# Patient Record
Sex: Female | Born: 1945 | Race: White | Hispanic: No | Marital: Married | Smoking: Never smoker
Health system: Southern US, Community
[De-identification: ages and names within clinical notes are randomized; demographics above are authoritative.]

## PROBLEM LIST (undated history)

## (undated) DIAGNOSIS — I10 Essential (primary) hypertension: Secondary | ICD-10-CM

## (undated) DIAGNOSIS — Z860100 Personal history of colon polyps, unspecified: Secondary | ICD-10-CM

## (undated) DIAGNOSIS — E7800 Pure hypercholesterolemia, unspecified: Secondary | ICD-10-CM

## (undated) DIAGNOSIS — E079 Disorder of thyroid, unspecified: Secondary | ICD-10-CM

## (undated) DIAGNOSIS — H269 Unspecified cataract: Secondary | ICD-10-CM

## (undated) DIAGNOSIS — Z87448 Personal history of other diseases of urinary system: Secondary | ICD-10-CM

## (undated) DIAGNOSIS — N183 Chronic kidney disease, stage 3 unspecified: Secondary | ICD-10-CM

## (undated) DIAGNOSIS — Z8719 Personal history of other diseases of the digestive system: Secondary | ICD-10-CM

## (undated) HISTORY — DX: Disorder of thyroid, unspecified: E07.9

## (undated) HISTORY — DX: Personal history of colonic polyps: Z86.010

## (undated) HISTORY — PX: TONSILLECTOMY: SUR1361

## (undated) HISTORY — DX: Personal history of colon polyps, unspecified: Z86.0100

## (undated) HISTORY — DX: Unspecified cataract: H26.9

## (undated) HISTORY — DX: Pure hypercholesterolemia, unspecified: E78.00

## (undated) HISTORY — PX: CATARACT EXTRACTION, BILATERAL: SHX1313

## (undated) HISTORY — DX: Chronic kidney disease, stage 3 unspecified: N18.30

## (undated) HISTORY — PX: TUBAL LIGATION: SHX77

## (undated) HISTORY — DX: Essential (primary) hypertension: I10

## (undated) HISTORY — DX: Personal history of other diseases of the digestive system: Z87.19

## (undated) HISTORY — DX: Personal history of other diseases of urinary system: Z87.448

---

## 2002-05-05 ENCOUNTER — Other Ambulatory Visit: Admission: RE | Admit: 2002-05-05 | Discharge: 2002-05-05 | Payer: Self-pay | Admitting: *Deleted

## 2003-06-07 ENCOUNTER — Other Ambulatory Visit: Admission: RE | Admit: 2003-06-07 | Discharge: 2003-06-07 | Payer: Self-pay | Admitting: *Deleted

## 2006-03-19 ENCOUNTER — Other Ambulatory Visit: Admission: RE | Admit: 2006-03-19 | Discharge: 2006-03-19 | Payer: Self-pay | Admitting: *Deleted

## 2010-11-13 ENCOUNTER — Encounter: Payer: Self-pay | Admitting: Internal Medicine

## 2010-12-14 ENCOUNTER — Ambulatory Visit (AMBULATORY_SURGERY_CENTER): Payer: 59 | Admitting: *Deleted

## 2010-12-14 ENCOUNTER — Encounter: Payer: Self-pay | Admitting: Internal Medicine

## 2010-12-14 VITALS — Ht 68.5 in | Wt 164.6 lb

## 2010-12-14 DIAGNOSIS — Z1211 Encounter for screening for malignant neoplasm of colon: Secondary | ICD-10-CM

## 2010-12-14 MED ORDER — PEG-KCL-NACL-NASULF-NA ASC-C 100 G PO SOLR: ORAL | Status: DC

## 2010-12-28 ENCOUNTER — Ambulatory Visit (AMBULATORY_SURGERY_CENTER): Payer: 59 | Admitting: Internal Medicine

## 2010-12-28 ENCOUNTER — Encounter: Payer: Self-pay | Admitting: Internal Medicine

## 2010-12-28 VITALS — BP 172/82 | HR 78 | Temp 98.0°F | Resp 20 | Ht 68.0 in | Wt 164.0 lb

## 2010-12-28 DIAGNOSIS — D126 Benign neoplasm of colon, unspecified: Secondary | ICD-10-CM

## 2010-12-28 DIAGNOSIS — K5730 Diverticulosis of large intestine without perforation or abscess without bleeding: Secondary | ICD-10-CM

## 2010-12-28 DIAGNOSIS — Z1211 Encounter for screening for malignant neoplasm of colon: Secondary | ICD-10-CM

## 2010-12-28 MED ORDER — SODIUM CHLORIDE 0.9 % IV SOLN
500.0000 mL | INTRAVENOUS | Status: DC
Start: 2010-12-28 — End: 2021-12-27

## 2010-12-28 NOTE — Progress Notes (Signed)
Pt grimacing and moaning on the way to the cecum.  Meds titrated per Dr. Lamar Sprinkles orders.  Once cecum reached, pt rested t/o rest of procedure, talking occasionally; no complaints of pain

## 2010-12-28 NOTE — Patient Instructions (Signed)
Please read the handouts given to you by your recovery room nurse.    Your biopsy results will be sent to you by mail within 2 weeks.  Due to the diverticulosis, you might want to increase the fiber in your diet.   Resume your routine medications today.   If you have any questions, please call us at [phone].  Thank-you.

## 2010-12-31 ENCOUNTER — Telehealth: Payer: Self-pay | Admitting: *Deleted

## 2010-12-31 NOTE — Telephone Encounter (Signed)

## 2013-04-03 LAB — LIPID PANEL
Cholesterol: 269 mg/dL — AB (ref 0–200)
HDL: 81 mg/dL — AB (ref 35–70)
LDL CALC: 168 mg/dL
TRIGLYCERIDES: 100 mg/dL (ref 40–160)

## 2013-04-03 LAB — CBC AND DIFFERENTIAL
HCT: 40 % (ref 36–46)
Hemoglobin: 13.5 g/dL (ref 12.0–16.0)
PLATELETS: 333 10*3/uL (ref 150–399)
WBC: 5.1 10*3/mL

## 2013-04-03 LAB — TSH: TSH: 5.6 u[IU]/mL (ref 0.41–5.90)

## 2013-04-03 LAB — HEPATIC FUNCTION PANEL
ALT: 7 U/L (ref 7–35)
AST: 10 U/L — AB (ref 13–35)
Alkaline Phosphatase: 77 U/L (ref 25–125)
Bilirubin, Total: 0.6 mg/dL

## 2013-04-03 LAB — BASIC METABOLIC PANEL
BUN: 23 mg/dL — AB (ref 4–21)
Creatinine: 1 mg/dL (ref 0.5–1.1)
Glucose: 89 mg/dL
Potassium: 4.3 mmol/L (ref 3.4–5.3)
Sodium: 141 mmol/L (ref 137–147)

## 2014-04-06 DIAGNOSIS — Z23 Encounter for immunization: Secondary | ICD-10-CM | POA: Diagnosis not present

## 2014-06-10 DIAGNOSIS — J00 Acute nasopharyngitis [common cold]: Secondary | ICD-10-CM | POA: Diagnosis not present

## 2014-06-16 DIAGNOSIS — D2262 Melanocytic nevi of left upper limb, including shoulder: Secondary | ICD-10-CM | POA: Diagnosis not present

## 2014-06-16 DIAGNOSIS — L821 Other seborrheic keratosis: Secondary | ICD-10-CM | POA: Diagnosis not present

## 2014-06-16 DIAGNOSIS — D225 Melanocytic nevi of trunk: Secondary | ICD-10-CM | POA: Diagnosis not present

## 2014-06-16 DIAGNOSIS — D2272 Melanocytic nevi of left lower limb, including hip: Secondary | ICD-10-CM | POA: Diagnosis not present

## 2014-07-05 ENCOUNTER — Telehealth: Payer: Self-pay

## 2014-07-05 NOTE — Telephone Encounter (Signed)
The patient is hoping to be a new patient with Dr.Scott.  She stated Dr.Scott is the only physician she is willing to see.  Do you want to add her?

## 2014-07-05 NOTE — Telephone Encounter (Signed)
ok 

## 2014-09-27 DIAGNOSIS — S0502XA Injury of conjunctiva and corneal abrasion without foreign body, left eye, initial encounter: Secondary | ICD-10-CM | POA: Diagnosis not present

## 2014-09-29 ENCOUNTER — Ambulatory Visit: Payer: Self-pay | Admitting: Internal Medicine

## 2014-09-29 DIAGNOSIS — L819 Disorder of pigmentation, unspecified: Secondary | ICD-10-CM | POA: Diagnosis not present

## 2014-09-29 DIAGNOSIS — B078 Other viral warts: Secondary | ICD-10-CM | POA: Diagnosis not present

## 2014-09-29 DIAGNOSIS — X32XXXD Exposure to sunlight, subsequent encounter: Secondary | ICD-10-CM | POA: Diagnosis not present

## 2014-09-29 DIAGNOSIS — L570 Actinic keratosis: Secondary | ICD-10-CM | POA: Diagnosis not present

## 2014-10-07 ENCOUNTER — Ambulatory Visit: Payer: Self-pay | Admitting: Internal Medicine

## 2014-10-18 ENCOUNTER — Encounter: Payer: Self-pay | Admitting: Internal Medicine

## 2014-10-18 ENCOUNTER — Ambulatory Visit (INDEPENDENT_AMBULATORY_CARE_PROVIDER_SITE_OTHER): Payer: Medicare Other | Admitting: Internal Medicine

## 2014-10-18 VITALS — BP 130/100 | HR 71 | Temp 98.2°F | Ht 67.75 in | Wt 158.0 lb

## 2014-10-18 DIAGNOSIS — E7800 Pure hypercholesterolemia, unspecified: Secondary | ICD-10-CM

## 2014-10-18 DIAGNOSIS — Z860100 Personal history of colon polyps, unspecified: Secondary | ICD-10-CM

## 2014-10-18 DIAGNOSIS — Z0000 Encounter for general adult medical examination without abnormal findings: Secondary | ICD-10-CM | POA: Diagnosis not present

## 2014-10-18 DIAGNOSIS — N8110 Cystocele, unspecified: Secondary | ICD-10-CM | POA: Diagnosis not present

## 2014-10-18 DIAGNOSIS — Z23 Encounter for immunization: Secondary | ICD-10-CM

## 2014-10-18 DIAGNOSIS — IMO0001 Reserved for inherently not codable concepts without codable children: Secondary | ICD-10-CM

## 2014-10-18 DIAGNOSIS — R030 Elevated blood-pressure reading, without diagnosis of hypertension: Secondary | ICD-10-CM

## 2014-10-18 NOTE — Progress Notes (Signed)
Pre visit review using our clinic review tool, if applicable. No additional management support is needed unless otherwise documented below in the visit note. 

## 2014-10-30 ENCOUNTER — Encounter: Payer: Self-pay | Admitting: Internal Medicine

## 2014-10-30 DIAGNOSIS — N8110 Cystocele, unspecified: Secondary | ICD-10-CM | POA: Insufficient documentation

## 2014-10-30 DIAGNOSIS — Z86010 Personal history of colonic polyps: Secondary | ICD-10-CM | POA: Insufficient documentation

## 2014-10-30 DIAGNOSIS — I10 Essential (primary) hypertension: Secondary | ICD-10-CM | POA: Insufficient documentation

## 2014-10-30 DIAGNOSIS — E7800 Pure hypercholesterolemia, unspecified: Secondary | ICD-10-CM | POA: Insufficient documentation

## 2014-10-30 DIAGNOSIS — Z0000 Encounter for general adult medical examination without abnormal findings: Secondary | ICD-10-CM | POA: Insufficient documentation

## 2014-10-30 NOTE — Progress Notes (Signed)
Patient ID: Shelby Spencer, female   DOB: 05-25-1946, 69 y.o.   MRN: [id number]   Subjective:    Patient ID: Shelby Spencer, female    DOB: [birth year], 69 y.o.   MRN: [id number]  HPI  Patient here to establish care.  Is retired.  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  [facility] been followed [facility].  All normal paps.  Has a history of colon polyps.  Recommended f/u colonoscopy in five years.  Need results of latest colonoscopy.  Has a slightly prolapsed bladder.  No urinary incontinence.  Stays active.  No cardiac symptoms with increased activity or exertion.  Breathing stable.  Bowels stable.    Past Medical History  Diagnosis Date  . History of colon polyps   . History of prolapse of bladder   . History of pyloric stenosis   . Hypercholesterolemia     Outpatient Encounter Prescriptions as of 10/18/2014  Medication Sig  . Calcium Carbonate-Vitamin D (CALCIUM + D PO) Take by mouth.  . doxycycline (VIBRAMYCIN) 50 MG capsule Take 50 mg by mouth daily.   Facility-Administered Encounter Medications as of 10/18/2014  Medication  . 0.9 %  sodium chloride infusion    Review of Systems  Constitutional: Negative for appetite change and unexpected weight change.  HENT: Negative for congestion and sinus pressure.   Respiratory: Negative for cough, chest tightness and shortness of breath.   Cardiovascular: Negative for chest pain, palpitations and leg swelling.  Gastrointestinal: Negative for nausea, vomiting, abdominal pain and diarrhea.  Genitourinary: Negative for dysuria and difficulty urinating.  Musculoskeletal: Negative for joint swelling.  Skin: Negative for color change and rash.  Neurological: Negative for dizziness, light-headedness and headaches.  Hematological: Negative for adenopathy. Does not bruise/bleed easily.  Psychiatric/Behavioral: Negative for dysphoric mood and agitation.       Objective:     Blood pressure recheck:  144/78  Physical Exam    Constitutional: She appears well-developed and well-nourished. No distress.  HENT:  Nose: Nose normal.  Mouth/Throat: Oropharynx is clear and moist.  Eyes: Right eye exhibits no discharge. Left eye exhibits no discharge. No scleral icterus.  Neck: Neck supple. No thyromegaly present.  Cardiovascular: Normal rate and regular rhythm.   Pulmonary/Chest: Breath sounds normal. No respiratory distress. She has no wheezes.  Abdominal: Soft. Bowel sounds are normal. There is no tenderness.  Musculoskeletal: She exhibits no edema or tenderness.  Lymphadenopathy:    She has no cervical adenopathy.  Skin: No rash noted. No erythema.  Psychiatric: She has a normal mood and affect. Her behavior is normal.    BP 130/100 mmHg  Pulse 71  Temp(Src) 98.2 F (36.8 C) (Oral)  Ht 5' 7.75" (1.721 m)  Wt 158 lb (71.668 kg)  BMI 24.20 kg/m2  SpO2 99% Wt Readings from Last 3 Encounters:  10/18/14 158 lb (71.668 kg)  12/28/10 164 lb (74.39 kg)  12/14/10 164 lb 9.6 oz (74.662 kg)     Lab Results  Component Value Date   WBC 5.1 04/03/2013   HGB 13.5 04/03/2013   HCT 40 04/03/2013   PLT 333 04/03/2013   CHOL 269* 04/03/2013   TRIG 100 04/03/2013   HDL 81* 04/03/2013   LDLCALC 168 04/03/2013   ALT 7 04/03/2013   AST 10* 04/03/2013   NA 141 04/03/2013   K 4.3 04/03/2013   CREATININE 1.0 04/03/2013   BUN 23* 04/03/2013   TSH 5.60 04/03/2013       Assessment &  Plan:   Problem List Items Addressed This Visit    Elevated blood pressure    Blood pressure elevated in the office.   Have her spot check her pressure.  Get her back in soon to reassess.  Check metabolic panel.        Relevant Orders   CBC with Differential/Platelet   TSH   Basic metabolic panel   Female bladder prolapse    No urinary incontinence.        Health care maintenance    Sees [facility] for her breast, pelvic and pap smears.  States had colonoscopy three years ago.  Tiny polyp.  Recommended f/u colonoscopy in five  years.  Need results.  Needs mammogram.        History of colonic polyps    Colonoscopy three years ago.  Tiny polyp.  Recommended f/u colonoscopy in five years.        Hypercholesterolemia    Low cholesterol diet and exercise.  Recheck lipid panel.        Relevant Orders   Lipid panel   Hepatic function panel    Other Visit Diagnoses    Need for prophylactic vaccination against Streptococcus pneumoniae (pneumococcus)    -  Primary    Relevant Orders    Pneumococcal conjugate vaccine 13-valent (Completed)      I spent 30 minutes with the patient and more than 50% of the time was spent in consultation regarding the above.     Shelby Pheasant, MD

## 2014-10-30 NOTE — Assessment & Plan Note (Signed)
No urinary incontinence.

## 2014-10-30 NOTE — Assessment & Plan Note (Signed)
Low cholesterol diet and exercise.  Recheck lipid panel.

## 2014-10-30 NOTE — Assessment & Plan Note (Signed)
Sees [facility] for her breast, pelvic and pap smears.  States had colonoscopy three years ago.  Tiny polyp.  Recommended f/u colonoscopy in five years.  Need results.  Needs mammogram.

## 2014-10-30 NOTE — Assessment & Plan Note (Signed)
Blood pressure elevated in the office.   Have her spot check her pressure.  Get her back in soon to reassess.  Check metabolic panel.

## 2014-10-30 NOTE — Assessment & Plan Note (Signed)
Colonoscopy three years ago.  Tiny polyp.  Recommended f/u colonoscopy in five years.

## 2014-11-01 ENCOUNTER — Other Ambulatory Visit (INDEPENDENT_AMBULATORY_CARE_PROVIDER_SITE_OTHER): Payer: Self-pay

## 2014-11-01 DIAGNOSIS — E7800 Pure hypercholesterolemia, unspecified: Secondary | ICD-10-CM

## 2014-11-01 DIAGNOSIS — R030 Elevated blood-pressure reading, without diagnosis of hypertension: Secondary | ICD-10-CM

## 2014-11-01 DIAGNOSIS — IMO0001 Reserved for inherently not codable concepts without codable children: Secondary | ICD-10-CM

## 2014-11-01 LAB — CBC WITH DIFFERENTIAL/PLATELET
BASOS ABS: 0 10*3/uL (ref 0.0–0.1)
Basophils Relative: 0.6 % (ref 0.0–3.0)
Eosinophils Absolute: 0.2 10*3/uL (ref 0.0–0.7)
Eosinophils Relative: 3.9 % (ref 0.0–5.0)
HEMATOCRIT: 41 % (ref 36.0–46.0)
Hemoglobin: 13.8 g/dL (ref 12.0–15.0)
LYMPHS ABS: 2.2 10*3/uL (ref 0.7–4.0)
Lymphocytes Relative: 39.3 % (ref 12.0–46.0)
MCHC: 33.7 g/dL (ref 30.0–36.0)
MCV: 89.1 fl (ref 78.0–100.0)
MONO ABS: 0.5 10*3/uL (ref 0.1–1.0)
Monocytes Relative: 9.5 % (ref 3.0–12.0)
NEUTROS PCT: 46.7 % (ref 43.0–77.0)
Neutro Abs: 2.6 10*3/uL (ref 1.4–7.7)
PLATELETS: 344 10*3/uL (ref 150.0–400.0)
RBC: 4.6 Mil/uL (ref 3.87–5.11)
RDW: 13.7 % (ref 11.5–15.5)
WBC: 5.6 10*3/uL (ref 4.0–10.5)

## 2014-11-01 LAB — BASIC METABOLIC PANEL
BUN: 19 mg/dL (ref 6–23)
CALCIUM: 9.7 mg/dL (ref 8.4–10.5)
CO2: 27 mEq/L (ref 19–32)
Chloride: 105 mEq/L (ref 96–112)
Creatinine, Ser: 1.11 mg/dL (ref 0.40–1.20)
GFR: 51.97 mL/min — AB (ref >60.00)
GLUCOSE: 92 mg/dL (ref 70–99)
Potassium: 4.7 mEq/L (ref 3.5–5.1)
Sodium: 139 mEq/L (ref 135–145)

## 2014-11-01 LAB — LIPID PANEL
CHOLESTEROL: 266 mg/dL — AB (ref 0–200)
HDL: 76.9 mg/dL (ref >39.00)
LDL CALC: 165 mg/dL — AB (ref 0–99)
NonHDL: 189.1
Total CHOL/HDL Ratio: 3
Triglycerides: 121 mg/dL (ref 0.0–149.0)
VLDL: 24.2 mg/dL (ref 0.0–40.0)

## 2014-11-01 LAB — HEPATIC FUNCTION PANEL
ALT: 10 U/L (ref 0–35)
AST: 12 U/L (ref 0–37)
Albumin: 4.4 g/dL (ref 3.5–5.2)
Alkaline Phosphatase: 62 U/L (ref 39–117)
Bilirubin, Direct: 0.1 mg/dL (ref 0.0–0.3)
Total Bilirubin: 0.6 mg/dL (ref 0.2–1.2)
Total Protein: 7.1 g/dL (ref 6.0–8.3)

## 2014-11-02 ENCOUNTER — Other Ambulatory Visit (INDEPENDENT_AMBULATORY_CARE_PROVIDER_SITE_OTHER): Payer: Medicare Other

## 2014-11-02 ENCOUNTER — Telehealth: Payer: Self-pay | Admitting: Internal Medicine

## 2014-11-02 ENCOUNTER — Encounter: Payer: Self-pay | Admitting: Internal Medicine

## 2014-11-02 DIAGNOSIS — R7989 Other specified abnormal findings of blood chemistry: Secondary | ICD-10-CM

## 2014-11-02 DIAGNOSIS — R030 Elevated blood-pressure reading, without diagnosis of hypertension: Principal | ICD-10-CM

## 2014-11-02 DIAGNOSIS — IMO0001 Reserved for inherently not codable concepts without codable children: Secondary | ICD-10-CM

## 2014-11-02 DIAGNOSIS — E7800 Pure hypercholesterolemia, unspecified: Secondary | ICD-10-CM

## 2014-11-02 LAB — TSH: TSH: 4.98 u[IU]/mL — AB (ref 0.35–4.50)

## 2014-11-02 NOTE — Telephone Encounter (Signed)
Pt was notified of labs via my chart.  Needs a f/u non fasting lab appt in 6-8 weeks.  Please notify the patient of the appointment date and time.  Thanks.   

## 2014-11-02 NOTE — Telephone Encounter (Signed)
Order placed for tsh 

## 2014-12-29 ENCOUNTER — Ambulatory Visit (INDEPENDENT_AMBULATORY_CARE_PROVIDER_SITE_OTHER): Payer: Medicare Other | Admitting: Internal Medicine

## 2014-12-29 ENCOUNTER — Encounter: Payer: Self-pay | Admitting: Internal Medicine

## 2014-12-29 VITALS — BP 120/80 | HR 64 | Temp 98.6°F | Ht 67.75 in | Wt 160.1 lb

## 2014-12-29 DIAGNOSIS — I10 Essential (primary) hypertension: Secondary | ICD-10-CM

## 2014-12-29 DIAGNOSIS — E7800 Pure hypercholesterolemia, unspecified: Secondary | ICD-10-CM

## 2014-12-29 MED ORDER — LISINOPRIL 10 MG PO TABS: 10.0000 mg | ORAL_TABLET | Freq: Every day | ORAL | Status: DC

## 2014-12-29 NOTE — Progress Notes (Signed)
Patient ID: Shelby Spencer, female   DOB: [birth year], 69 y.o.   MRN: [id number]   Subjective:    Patient ID: Shelby Spencer, female    DOB: 21-May-1946, 69 y.o.   MRN: [id number]  HPI  Patient here for a scheduled follow up.  Here to f/u regarding her blood pressure.  She stays active.  Monitoring her diet.  Exercises.  No cardiac symptoms with increased activity or exertion.  No sob.  Bowels stable.  Blood pressures averaging 140s/80s.  See attached list.     Past Medical History  Diagnosis Date  . History of colon polyps   . History of prolapse of bladder   . History of pyloric stenosis   . Hypercholesterolemia     Outpatient Encounter Prescriptions as of 12/29/2014  Medication Sig  . Calcium Carbonate-Vitamin D (CALCIUM + D PO) Take by mouth.  . doxycycline (VIBRAMYCIN) 50 MG capsule Take 50 mg by mouth daily.  Marland Kitchen lisinopril (PRINIVIL,ZESTRIL) 10 MG tablet Take 1 tablet (10 mg total) by mouth daily.   Facility-Administered Encounter Medications as of 12/29/2014  Medication  . 0.9 %  sodium chloride infusion    Review of Systems  Constitutional: Negative for appetite change and unexpected weight change.  HENT: Negative for congestion and sinus pressure.   Respiratory: Negative for cough, chest tightness and shortness of breath.   Cardiovascular: Negative for chest pain, palpitations and leg swelling.  Gastrointestinal: Negative for nausea, vomiting, abdominal pain and diarrhea.  Neurological: Negative for dizziness, light-headedness and headaches.  Psychiatric/Behavioral: Negative for dysphoric mood and agitation.       Objective:     Blood pressure recheck;  148/86  Physical Exam  Constitutional: She appears well-developed and well-nourished. No distress.  HENT:  Nose: Nose normal.  Mouth/Throat: Oropharynx is clear and moist.  Neck: Neck supple. No thyromegaly present.  Cardiovascular: Normal rate and regular rhythm.   Pulmonary/Chest: Breath sounds normal. No  respiratory distress. She has no wheezes.  Abdominal: Soft. Bowel sounds are normal. There is no tenderness.  Musculoskeletal: She exhibits no edema or tenderness.  Lymphadenopathy:    She has no cervical adenopathy.  Skin: No rash noted. No erythema.  Psychiatric: She has a normal mood and affect. Her behavior is normal.    BP 120/80 mmHg  Pulse 64  Temp(Src) 98.6 F (37 C) (Oral)  Ht 5' 7.75" (1.721 m)  Wt 160 lb 2 oz (72.632 kg)  BMI 24.52 kg/m2  SpO2 98% Wt Readings from Last 3 Encounters:  12/29/14 160 lb 2 oz (72.632 kg)  10/18/14 158 lb (71.668 kg)  12/28/10 164 lb (74.39 kg)     Lab Results  Component Value Date   WBC 5.6 11/01/2014   HGB 13.8 11/01/2014   HCT 41.0 11/01/2014   PLT 344.0 11/01/2014   GLUCOSE 92 11/01/2014   CHOL 266* 11/01/2014   TRIG 121.0 11/01/2014   HDL 76.90 11/01/2014   LDLCALC 165* 11/01/2014   ALT 10 11/01/2014   AST 12 11/01/2014   NA 139 11/01/2014   K 4.7 11/01/2014   CL 105 11/01/2014   CREATININE 1.11 11/01/2014   BUN 19 11/01/2014   CO2 27 11/01/2014   TSH 4.98* 11/02/2014       Assessment & Plan:   Problem List Items Addressed This Visit    Hypercholesterolemia    Low cholesterol diet and exercise.  Follow lipid panel.       Relevant Medications   lisinopril (PRINIVIL,ZESTRIL) 10 MG tablet  Hypertension, essential    Blood pressures as outlined.  Given persistent elevation, start lisinopril 10mg  q day.  Follow pressures.  Will check metabolic panel in 84-66 days.        Relevant Medications   lisinopril (PRINIVIL,ZESTRIL) 10 MG tablet    Other Visit Diagnoses    Essential hypertension    -  Primary    Relevant Medications    lisinopril (PRINIVIL,ZESTRIL) 10 MG tablet    Other Relevant Orders    Basic metabolic panel        Einar Pheasant, MD

## 2014-12-29 NOTE — Progress Notes (Signed)
Pre visit review using our clinic review tool, if applicable. No additional management support is needed unless otherwise documented below in the visit note. 

## 2014-12-30 ENCOUNTER — Other Ambulatory Visit: Payer: Medicare Other

## 2014-12-31 ENCOUNTER — Encounter: Payer: Self-pay | Admitting: Internal Medicine

## 2014-12-31 NOTE — Assessment & Plan Note (Signed)
Low cholesterol diet and exercise.  Follow lipid panel.   

## 2014-12-31 NOTE — Assessment & Plan Note (Signed)
Blood pressures as outlined.  Given persistent elevation, start lisinopril 10mg  q day.  Follow pressures.  Will check metabolic panel in 28-63 days.

## 2015-01-10 ENCOUNTER — Other Ambulatory Visit (INDEPENDENT_AMBULATORY_CARE_PROVIDER_SITE_OTHER): Payer: Medicare Other

## 2015-01-10 DIAGNOSIS — R946 Abnormal results of thyroid function studies: Secondary | ICD-10-CM

## 2015-01-10 DIAGNOSIS — R7989 Other specified abnormal findings of blood chemistry: Secondary | ICD-10-CM

## 2015-01-10 DIAGNOSIS — I10 Essential (primary) hypertension: Secondary | ICD-10-CM | POA: Diagnosis not present

## 2015-01-10 LAB — BASIC METABOLIC PANEL
BUN: 25 mg/dL — ABNORMAL HIGH (ref 6–23)
CO2: 26 mEq/L (ref 19–32)
Calcium: 9.8 mg/dL (ref 8.4–10.5)
Chloride: 103 mEq/L (ref 96–112)
Creatinine, Ser: 1.18 mg/dL (ref 0.40–1.20)
GFR: 48.4 mL/min — ABNORMAL LOW (ref >60.00)
GLUCOSE: 103 mg/dL — AB (ref 70–99)
Potassium: 5.1 mEq/L (ref 3.5–5.1)
Sodium: 137 mEq/L (ref 135–145)

## 2015-01-10 LAB — TSH: TSH: 4.32 u[IU]/mL (ref 0.35–4.50)

## 2015-01-11 ENCOUNTER — Other Ambulatory Visit: Payer: Self-pay | Admitting: Internal Medicine

## 2015-01-11 DIAGNOSIS — R7989 Other specified abnormal findings of blood chemistry: Secondary | ICD-10-CM

## 2015-01-11 NOTE — Progress Notes (Signed)
Order placed for f/u met b.  

## 2015-01-19 ENCOUNTER — Encounter: Payer: Self-pay | Admitting: *Deleted

## 2015-02-02 ENCOUNTER — Ambulatory Visit (INDEPENDENT_AMBULATORY_CARE_PROVIDER_SITE_OTHER): Payer: Medicare Other | Admitting: Internal Medicine

## 2015-02-02 ENCOUNTER — Encounter: Payer: Self-pay | Admitting: Internal Medicine

## 2015-02-02 VITALS — BP 128/80 | HR 69 | Temp 97.9°F | Ht 67.75 in | Wt 156.5 lb

## 2015-02-02 DIAGNOSIS — I10 Essential (primary) hypertension: Secondary | ICD-10-CM

## 2015-02-02 DIAGNOSIS — E7800 Pure hypercholesterolemia, unspecified: Secondary | ICD-10-CM

## 2015-02-02 NOTE — Progress Notes (Signed)
Pre-visit discussion using our clinic review tool. No additional management support is needed unless otherwise documented below in the visit note.  

## 2015-02-02 NOTE — Progress Notes (Signed)
Patient ID: Shelby Spencer, female   DOB: [birth year], 69 y.o.   MRN: [id number]   Subjective:    Patient ID: [facility], female    DOB: December 28, 1945, 69 y.o.   MRN: [id number]  HPI  Patient here for a scheduled follow up.  Last visit, she was started on lisinopril.  Here to f/u on her blood pressure.  Tolerating her blood pressure medication without problems.  Staying active.  No cardiac symptoms with increased activity or exertion.  No sob. Eating and drinking well.  Bowels stable.  Blood pressures averaging 120's/70s.     Past Medical History  Diagnosis Date  . History of colon polyps   . History of prolapse of bladder   . History of pyloric stenosis   . Hypercholesterolemia    Past Surgical History  Procedure Laterality Date  . Cataract extraction, bilateral    . Tubal ligation     Family History  Problem Relation Age of Onset  . Hypertension Mother   . Heart disease Father    Social History   Social History  . Marital Status: Married    Spouse Name: N/A  . Number of Children: N/A  . Years of Education: N/A   Social History Main Topics  . Smoking status: Never Smoker   . Smokeless tobacco: Never Used  . Alcohol Use: 1.8 oz/week    3 Glasses of wine per week  . Drug Use: No  . Sexual Activity: Not Asked   Other Topics Concern  . None   Social History Narrative    Outpatient Encounter Prescriptions as of 02/02/2015  Medication Sig  . Calcium Carbonate-Vitamin D (CALCIUM + D PO) Take by mouth.  . doxycycline (VIBRAMYCIN) 50 MG capsule Take 50 mg by mouth daily.  Marland Kitchen lisinopril (PRINIVIL,ZESTRIL) 10 MG tablet Take 1 tablet (10 mg total) by mouth daily.   Facility-Administered Encounter Medications as of 02/02/2015  Medication  . 0.9 %  sodium chloride infusion    Review of Systems  Constitutional: Negative for appetite change and unexpected weight change.  HENT: Negative for congestion and sinus pressure.   Respiratory: Negative for cough, chest tightness and  shortness of breath.   Cardiovascular: Negative for chest pain, palpitations and leg swelling.  Gastrointestinal: Negative for nausea, vomiting and abdominal pain.  Neurological: Negative for dizziness, light-headedness and headaches.       Objective:    Physical Exam  Constitutional: She appears well-developed and well-nourished. No distress.  HENT:  Nose: Nose normal.  Mouth/Throat: Oropharynx is clear and moist.  Eyes: Conjunctivae are normal. Right eye exhibits no discharge. Left eye exhibits no discharge.  Neck: Neck supple. No thyromegaly present.  Cardiovascular: Normal rate and regular rhythm.   Pulmonary/Chest: Breath sounds normal. No respiratory distress. She has no wheezes.  Abdominal: Soft. Bowel sounds are normal. There is no tenderness.  Musculoskeletal: She exhibits no edema or tenderness.  Lymphadenopathy:    She has no cervical adenopathy.    BP 128/80 mmHg  Pulse 69  Temp(Src) 97.9 F (36.6 C) (Oral)  Ht 5' 7.75" (1.721 m)  Wt 156 lb 8 oz (70.988 kg)  BMI 23.97 kg/m2  SpO2 99% Wt Readings from Last 3 Encounters:  02/02/15 156 lb 8 oz (70.988 kg)  12/29/14 160 lb 2 oz (72.632 kg)  10/18/14 158 lb (71.668 kg)     Lab Results  Component Value Date   WBC 5.6 11/01/2014   HGB 13.8 11/01/2014   HCT 41.0 11/01/2014  PLT 344.0 11/01/2014   GLUCOSE 103* 01/10/2015   CHOL 266* 11/01/2014   TRIG 121.0 11/01/2014   HDL 76.90 11/01/2014   LDLCALC 165* 11/01/2014   ALT 10 11/01/2014   AST 12 11/01/2014   NA 137 01/10/2015   K 5.1 01/10/2015   CL 103 01/10/2015   CREATININE 1.18 01/10/2015   BUN 25* 01/10/2015   CO2 26 01/10/2015   TSH 4.32 01/10/2015       Assessment & Plan:   Problem List Items Addressed This Visit    Hypercholesterolemia    Low cholesterol diet and exercise.  Follow lipid panel.        Hypertension, essential - Primary    Blood pressure under good control.  Doing well on the lisinopril.  Continue same medication regimen.   Follow pressures.  Follow metabolic panel.            Einar Pheasant, MD

## 2015-02-06 ENCOUNTER — Encounter: Payer: Self-pay | Admitting: Internal Medicine

## 2015-02-06 NOTE — Assessment & Plan Note (Signed)
Low cholesterol diet and exercise.  Follow lipid panel.   

## 2015-02-06 NOTE — Assessment & Plan Note (Signed)
Blood pressure under good control.  Doing well on the lisinopril.  Continue same medication regimen.  Follow pressures.  Follow metabolic panel.

## 2015-02-08 ENCOUNTER — Other Ambulatory Visit (INDEPENDENT_AMBULATORY_CARE_PROVIDER_SITE_OTHER): Payer: Medicare Other

## 2015-02-08 DIAGNOSIS — R748 Abnormal levels of other serum enzymes: Secondary | ICD-10-CM

## 2015-02-08 DIAGNOSIS — R7989 Other specified abnormal findings of blood chemistry: Secondary | ICD-10-CM

## 2015-02-08 LAB — BASIC METABOLIC PANEL
BUN: 26 mg/dL — AB (ref 6–23)
CHLORIDE: 104 meq/L (ref 96–112)
CO2: 25 meq/L (ref 19–32)
Calcium: 9.4 mg/dL (ref 8.4–10.5)
Creatinine, Ser: 1.13 mg/dL (ref 0.40–1.20)
GFR: 50.87 mL/min — ABNORMAL LOW (ref >60.00)
GLUCOSE: 112 mg/dL — AB (ref 70–99)
Potassium: 4.9 mEq/L (ref 3.5–5.1)
SODIUM: 138 meq/L (ref 135–145)

## 2015-02-09 ENCOUNTER — Encounter: Payer: Self-pay | Admitting: Internal Medicine

## 2015-02-15 DIAGNOSIS — Z23 Encounter for immunization: Secondary | ICD-10-CM | POA: Diagnosis not present

## 2015-02-16 ENCOUNTER — Encounter: Payer: Self-pay | Admitting: Internal Medicine

## 2015-02-21 DIAGNOSIS — M722 Plantar fascial fibromatosis: Secondary | ICD-10-CM | POA: Diagnosis not present

## 2015-02-21 DIAGNOSIS — D489 Neoplasm of uncertain behavior, unspecified: Secondary | ICD-10-CM | POA: Diagnosis not present

## 2015-02-21 DIAGNOSIS — M7989 Other specified soft tissue disorders: Secondary | ICD-10-CM | POA: Diagnosis not present

## 2015-02-28 ENCOUNTER — Other Ambulatory Visit: Payer: Self-pay

## 2015-02-28 MED ORDER — LISINOPRIL 10 MG PO TABS: 10.0000 mg | ORAL_TABLET | Freq: Every day | ORAL | Status: DC

## 2015-03-21 DIAGNOSIS — Z1231 Encounter for screening mammogram for malignant neoplasm of breast: Secondary | ICD-10-CM | POA: Diagnosis not present

## 2015-03-21 DIAGNOSIS — M722 Plantar fascial fibromatosis: Secondary | ICD-10-CM | POA: Diagnosis not present

## 2015-03-21 DIAGNOSIS — D489 Neoplasm of uncertain behavior, unspecified: Secondary | ICD-10-CM | POA: Diagnosis not present

## 2015-03-21 LAB — HM MAMMOGRAPHY: HM MAMMO: NEGATIVE

## 2015-03-24 ENCOUNTER — Encounter: Payer: Self-pay | Admitting: Internal Medicine

## 2015-05-05 ENCOUNTER — Ambulatory Visit (INDEPENDENT_AMBULATORY_CARE_PROVIDER_SITE_OTHER): Payer: Medicare Other | Admitting: Internal Medicine

## 2015-05-05 ENCOUNTER — Encounter: Payer: Self-pay | Admitting: Internal Medicine

## 2015-05-05 VITALS — BP 128/76 | HR 65 | Temp 97.6°F | Resp 18 | Ht 67.75 in | Wt 159.4 lb

## 2015-05-05 DIAGNOSIS — R7989 Other specified abnormal findings of blood chemistry: Secondary | ICD-10-CM

## 2015-05-05 DIAGNOSIS — Z86010 Personal history of colonic polyps: Secondary | ICD-10-CM

## 2015-05-05 DIAGNOSIS — E7800 Pure hypercholesterolemia, unspecified: Secondary | ICD-10-CM

## 2015-05-05 DIAGNOSIS — I10 Essential (primary) hypertension: Secondary | ICD-10-CM | POA: Diagnosis not present

## 2015-05-05 NOTE — Progress Notes (Signed)
Pre-visit discussion using our clinic review tool. No additional management support is needed unless otherwise documented below in the visit note.  

## 2015-05-05 NOTE — Progress Notes (Signed)
Patient ID: MUNA VERSTEEG, female   DOB: September 03, 1945, 69 y.o.   MRN: [id number]   Subjective:    Patient ID: LEMON SUPPA, female    DOB: 1945-10-08, 69 y.o.   MRN: [id number]  HPI  Patient with past history of hypercholesterolemia and hypertension.  She comes in today for a scheduled follow up.  States she feels good.  Followed at [facility] for her gyn exams.  She stays active.  No cardiac symptoms with increased activity or exertion.  No sob.  No acid reflux reported.  No abdominal pain or cramping.  Bowels stable.  Has not been checking her blood pressures.  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     Past Medical History  Diagnosis Date  . History of colon polyps   . History of prolapse of bladder   . History of pyloric stenosis   . Hypercholesterolemia    Past Surgical History  Procedure Laterality Date  . Cataract extraction, bilateral    . Tubal ligation     Family History  Problem Relation Age of Onset  . Hypertension Mother   . Heart disease Father    Social History   Social History  . Marital Status: Married    Spouse Name: N/A  . Number of Children: N/A  . Years of Education: N/A   Social History Main Topics  . Smoking status: Never Smoker   . Smokeless tobacco: Never Used  . Alcohol Use: 1.8 oz/week    3 Glasses of wine per week  . Drug Use: No  . Sexual Activity: Not Asked   Other Topics Concern  . None   Social History Narrative    Outpatient Encounter Prescriptions as of 05/05/2015  Medication Sig  . Calcium Carbonate-Vitamin D (CALCIUM + D PO) Take by mouth.  . doxycycline (VIBRAMYCIN) 50 MG capsule Take 50 mg by mouth daily.  Marland Kitchen lisinopril (PRINIVIL,ZESTRIL) 10 MG tablet Take 1 tablet (10 mg total) by mouth daily.   Facility-Administered Encounter Medications as of 05/05/2015  Medication  . 0.9 %  sodium chloride infusion    Review of Systems  Constitutional: Negative for appetite change and unexpected weight change.  HENT:  Negative for congestion and sinus pressure.   Respiratory: Negative for cough, chest tightness and shortness of breath.   Cardiovascular: Negative for chest pain, palpitations and leg swelling.  Gastrointestinal: Negative for nausea, vomiting, abdominal pain and diarrhea.  Genitourinary: Negative for dysuria and difficulty urinating.  Musculoskeletal: Negative for back pain and joint swelling.  Neurological: Negative for dizziness, light-headedness and headaches.  Psychiatric/Behavioral: Negative for dysphoric mood and agitation.       Objective:    Physical Exam  Constitutional: She appears well-developed and well-nourished. No distress.  HENT:  Nose: Nose normal.  Mouth/Throat: Oropharynx is clear and moist.  Eyes: Conjunctivae are normal. Right eye exhibits no discharge. Left eye exhibits no discharge.  Neck: Neck supple. No thyromegaly present.  Cardiovascular: Normal rate and regular rhythm.   Pulmonary/Chest: Breath sounds normal. No respiratory distress. She has no wheezes.  Abdominal: Soft. Bowel sounds are normal. There is no tenderness.  Musculoskeletal: She exhibits no edema or tenderness.  Lymphadenopathy:    She has no cervical adenopathy.  Skin: No rash noted. No erythema.  Psychiatric: She has a normal mood and affect. Her behavior is normal.    BP 128/76 mmHg  Pulse 65  Temp(Src) 97.6 F (36.4 C) (Oral)  Resp 18  Ht 5' 7.75" (1.721  m)  Wt 159 lb 6 oz (72.292 kg)  BMI 24.41 kg/m2  SpO2 98% Wt Readings from Last 3 Encounters:  05/05/15 159 lb 6 oz (72.292 kg)  02/02/15 156 lb 8 oz (70.988 kg)  12/29/14 160 lb 2 oz (72.632 kg)     Lab Results  Component Value Date   WBC 5.6 11/01/2014   HGB 13.8 11/01/2014   HCT 41.0 11/01/2014   PLT 344.0 11/01/2014   GLUCOSE 112* 02/08/2015   CHOL 266* 11/01/2014   TRIG 121.0 11/01/2014   HDL 76.90 11/01/2014   LDLCALC 165* 11/01/2014   ALT 10 11/01/2014   AST 12 11/01/2014   NA 138 02/08/2015   K 4.9  02/08/2015   CL 104 02/08/2015   CREATININE 1.13 02/08/2015   BUN 26* 02/08/2015   CO2 25 02/08/2015   TSH 4.32 01/10/2015       Assessment & Plan:   Problem List Items Addressed This Visit    History of colonic polyps    Has had colonoscopy previously.  She had initially reported f/u recommended in five years.  Obtain copy of last colonoscopy.  She will bring.        Hypercholesterolemia    Low cholesterol diet and exercise.  Follow lipid panel.        Relevant Orders   Lipid panel   Hypertension, essential - Primary    On lisinopril and tolerating.  Blood pressure on my initial recheck slightly elevated.  Improved prior to leaving.  Continue same medication regimen.  Follow pressures.  Follow metabolic panel.        Relevant Orders   Comprehensive metabolic panel    Other Visit Diagnoses    Elevated TSH        Relevant Orders    TSH        Einar Pheasant, MD

## 2015-05-07 ENCOUNTER — Encounter: Payer: Self-pay | Admitting: Internal Medicine

## 2015-05-07 NOTE — Assessment & Plan Note (Signed)
Has had colonoscopy previously.  She had initially reported f/u recommended in five years.  Obtain copy of last colonoscopy.  She will bring.

## 2015-05-07 NOTE — Assessment & Plan Note (Signed)
On lisinopril and tolerating.  Blood pressure on my initial recheck slightly elevated.  Improved prior to leaving.  Continue same medication regimen.  Follow pressures.  Follow metabolic panel.

## 2015-05-07 NOTE — Assessment & Plan Note (Signed)
Low cholesterol diet and exercise.  Follow lipid panel.   

## 2015-06-07 ENCOUNTER — Other Ambulatory Visit: Payer: Medicare Other

## 2015-06-15 ENCOUNTER — Other Ambulatory Visit (INDEPENDENT_AMBULATORY_CARE_PROVIDER_SITE_OTHER): Payer: Medicare Other

## 2015-06-15 DIAGNOSIS — R946 Abnormal results of thyroid function studies: Secondary | ICD-10-CM | POA: Diagnosis not present

## 2015-06-15 DIAGNOSIS — R7989 Other specified abnormal findings of blood chemistry: Secondary | ICD-10-CM

## 2015-06-15 DIAGNOSIS — I10 Essential (primary) hypertension: Secondary | ICD-10-CM

## 2015-06-15 DIAGNOSIS — E7800 Pure hypercholesterolemia, unspecified: Secondary | ICD-10-CM | POA: Diagnosis not present

## 2015-06-15 LAB — LIPID PANEL
CHOL/HDL RATIO: 3
Cholesterol: 280 mg/dL — ABNORMAL HIGH (ref 0–200)
HDL: 90.5 mg/dL (ref >39.00)
LDL CALC: 167 mg/dL — AB (ref 0–99)
NonHDL: 189.17
TRIGLYCERIDES: 110 mg/dL (ref 0.0–149.0)
VLDL: 22 mg/dL (ref 0.0–40.0)

## 2015-06-15 LAB — COMPREHENSIVE METABOLIC PANEL
ALT: 9 U/L (ref 0–35)
AST: 11 U/L (ref 0–37)
Albumin: 4.5 g/dL (ref 3.5–5.2)
Alkaline Phosphatase: 66 U/L (ref 39–117)
BUN: 23 mg/dL (ref 6–23)
CO2: 28 meq/L (ref 19–32)
Calcium: 10.1 mg/dL (ref 8.4–10.5)
Chloride: 103 mEq/L (ref 96–112)
Creatinine, Ser: 1.12 mg/dL (ref 0.40–1.20)
GFR: 51.34 mL/min — ABNORMAL LOW (ref >60.00)
Glucose, Bld: 97 mg/dL (ref 70–99)
POTASSIUM: 5.4 meq/L — AB (ref 3.5–5.1)
Sodium: 140 mEq/L (ref 135–145)
Total Bilirubin: 0.6 mg/dL (ref 0.2–1.2)
Total Protein: 6.6 g/dL (ref 6.0–8.3)

## 2015-06-15 LAB — TSH: TSH: 5.42 u[IU]/mL — AB (ref 0.35–4.50)

## 2015-06-16 ENCOUNTER — Other Ambulatory Visit: Payer: Self-pay | Admitting: Internal Medicine

## 2015-06-16 ENCOUNTER — Encounter: Payer: Self-pay | Admitting: *Deleted

## 2015-06-16 DIAGNOSIS — E875 Hyperkalemia: Secondary | ICD-10-CM

## 2015-06-16 NOTE — Progress Notes (Signed)
Order placed for f/u potassium.  

## 2015-06-19 ENCOUNTER — Other Ambulatory Visit: Payer: Medicare Other

## 2015-06-23 ENCOUNTER — Other Ambulatory Visit: Payer: Medicare Other

## 2015-06-23 NOTE — Telephone Encounter (Signed)
Unread mychart message mailed to patient 

## 2015-06-26 ENCOUNTER — Encounter: Payer: Self-pay | Admitting: Internal Medicine

## 2015-06-28 ENCOUNTER — Other Ambulatory Visit (INDEPENDENT_AMBULATORY_CARE_PROVIDER_SITE_OTHER): Payer: Medicare Other

## 2015-06-28 DIAGNOSIS — E875 Hyperkalemia: Secondary | ICD-10-CM

## 2015-06-28 LAB — POTASSIUM: POTASSIUM: 5.3 meq/L — AB (ref 3.5–5.1)

## 2015-06-29 ENCOUNTER — Encounter: Payer: Self-pay | Admitting: *Deleted

## 2015-06-29 ENCOUNTER — Other Ambulatory Visit: Payer: Self-pay | Admitting: Internal Medicine

## 2015-06-29 DIAGNOSIS — E875 Hyperkalemia: Secondary | ICD-10-CM

## 2015-06-29 NOTE — Progress Notes (Signed)
Order placed for f/u potassium.  

## 2015-07-10 ENCOUNTER — Other Ambulatory Visit (INDEPENDENT_AMBULATORY_CARE_PROVIDER_SITE_OTHER): Payer: Medicare Other

## 2015-07-10 DIAGNOSIS — E875 Hyperkalemia: Secondary | ICD-10-CM | POA: Diagnosis not present

## 2015-07-10 LAB — POTASSIUM: Potassium: 5.1 mEq/L (ref 3.5–5.1)

## 2015-07-11 ENCOUNTER — Encounter: Payer: Self-pay | Admitting: Internal Medicine

## 2015-07-13 NOTE — Telephone Encounter (Signed)
Unread mychart message mailed to patient 

## 2015-08-07 ENCOUNTER — Encounter: Payer: Self-pay | Admitting: Internal Medicine

## 2015-08-07 ENCOUNTER — Other Ambulatory Visit: Payer: Self-pay | Admitting: Internal Medicine

## 2015-09-25 ENCOUNTER — Ambulatory Visit: Payer: Medicare Other | Admitting: Internal Medicine

## 2015-09-27 ENCOUNTER — Ambulatory Visit (INDEPENDENT_AMBULATORY_CARE_PROVIDER_SITE_OTHER): Payer: Medicare Other | Admitting: Internal Medicine

## 2015-09-27 ENCOUNTER — Encounter: Payer: Self-pay | Admitting: Internal Medicine

## 2015-09-27 VITALS — BP 140/80 | HR 68 | Temp 97.9°F | Resp 18 | Ht 67.75 in | Wt 163.4 lb

## 2015-09-27 DIAGNOSIS — R946 Abnormal results of thyroid function studies: Secondary | ICD-10-CM | POA: Diagnosis not present

## 2015-09-27 DIAGNOSIS — E7800 Pure hypercholesterolemia, unspecified: Secondary | ICD-10-CM | POA: Diagnosis not present

## 2015-09-27 DIAGNOSIS — R7989 Other specified abnormal findings of blood chemistry: Secondary | ICD-10-CM

## 2015-09-27 DIAGNOSIS — I10 Essential (primary) hypertension: Secondary | ICD-10-CM | POA: Diagnosis not present

## 2015-09-27 DIAGNOSIS — R0989 Other specified symptoms and signs involving the circulatory and respiratory systems: Secondary | ICD-10-CM

## 2015-09-27 MED ORDER — LOSARTAN POTASSIUM 25 MG PO TABS: 25.0000 mg | ORAL_TABLET | Freq: Every day | ORAL | Status: DC

## 2015-09-27 NOTE — Progress Notes (Signed)
Patient ID: Shelby Spencer, female   DOB: 27-Sep-1945, 70 y.o.   MRN: [id number]   Subjective:    Patient ID: Shelby Spencer, female    DOB: 02-Nov-1945, 70 y.o.   MRN: [id number]  HPI  Patient here for a scheduled follow up.  She states she is doing well.  Feels good.  Stays active.  No cardiac symptoms with increased activity or exertion.  No sob.  No acid reflux.  No abdominal pain or cramping.  Bowels stable.  Blood pressure has been averaging [id number] (mostly) systolics.  On one occasion - [id number] systolic.  Taking her medication regularly.     Past Medical History  Diagnosis Date  . History of colon polyps   . History of prolapse of bladder   . History of pyloric stenosis   . Hypercholesterolemia    Past Surgical History  Procedure Laterality Date  . Cataract extraction, bilateral    . Tubal ligation     Family History  Problem Relation Age of Onset  . Hypertension Mother   . Heart disease Father    Social History   Social History  . Marital Status: Married    Spouse Name: N/A  . Number of Children: N/A  . Years of Education: N/A   Social History Main Topics  . Smoking status: Never Smoker   . Smokeless tobacco: Never Used  . Alcohol Use: 1.8 oz/week    3 Glasses of wine per week  . Drug Use: No  . Sexual Activity: Not Asked   Other Topics Concern  . None   Social History Narrative    Outpatient Encounter Prescriptions as of 09/27/2015  Medication Sig  . Calcium Carbonate-Vitamin D (CALCIUM + D PO) Take by mouth.  . doxycycline (VIBRAMYCIN) 50 MG capsule Take 50 mg by mouth daily.  . [DISCONTINUED] lisinopril (PRINIVIL,ZESTRIL) 10 MG tablet TAKE 1 TABLET BY MOUTH ONCE DAILY.  Marland Kitchen losartan (COZAAR) 25 MG tablet Take 1 tablet (25 mg total) by mouth daily.   Facility-Administered Encounter Medications as of 09/27/2015  Medication  . 0.9 %  sodium chloride infusion    Review of Systems  Constitutional: Negative for appetite change and unexpected weight change.  HENT:  Negative for congestion and sinus pressure.   Respiratory: Negative for cough, chest tightness and shortness of breath.   Cardiovascular: Negative for chest pain, palpitations and leg swelling.  Gastrointestinal: Negative for abdominal pain and diarrhea.  Genitourinary: Negative for dysuria and difficulty urinating.  Musculoskeletal: Negative for back pain and joint swelling.  Skin: Negative for color change and rash.  Neurological: Negative for dizziness, light-headedness and headaches.  Psychiatric/Behavioral: Negative for dysphoric mood and agitation.       Objective:    Physical Exam  Constitutional: She appears well-developed and well-nourished. No distress.  HENT:  Nose: Nose normal.  Mouth/Throat: Oropharynx is clear and moist.  Neck: Neck supple. No thyromegaly present.  Cardiovascular: Normal rate and regular rhythm.   Pulmonary/Chest: Breath sounds normal. No respiratory distress. She has no wheezes.  Abdominal: Soft. Bowel sounds are normal. There is no tenderness.  Musculoskeletal: She exhibits no edema or tenderness.  Lymphadenopathy:    She has no cervical adenopathy.  Skin: No rash noted. No erythema.  Psychiatric: She has a normal mood and affect. Her behavior is normal.    BP 140/80 mmHg  Pulse 68  Temp(Src) 97.9 F (36.6 C) (Oral)  Resp 18  Ht 5' 7.75" (1.721 m)  Wt 163 lb 6 oz (  74.106 kg)  BMI 25.02 kg/m2  SpO2 97% Wt Readings from Last 3 Encounters:  09/27/15 163 lb 6 oz (74.106 kg)  05/05/15 159 lb 6 oz (72.292 kg)  02/02/15 156 lb 8 oz (70.988 kg)     Lab Results  Component Value Date   WBC 5.6 11/01/2014   HGB 13.8 11/01/2014   HCT 41.0 11/01/2014   PLT 344.0 11/01/2014   GLUCOSE 97 06/15/2015   CHOL 280* 06/15/2015   TRIG 110.0 06/15/2015   HDL 90.50 06/15/2015   LDLCALC 167* 06/15/2015   ALT 9 06/15/2015   AST 11 06/15/2015   NA 140 06/15/2015   K 5.1 07/10/2015   CL 103 06/15/2015   CREATININE 1.12 06/15/2015   BUN 23 06/15/2015    CO2 28 06/15/2015   TSH 5.42* 06/15/2015       Assessment & Plan:   Problem List Items Addressed This Visit    Abdominal bruit    Schedule abdominal ultrasound.        Relevant Orders   US Abdomen Complete   Hypercholesterolemia    Low cholesterol diet and exercise.  Follow lipid panel.       Relevant Medications   losartan (COZAAR) 25 MG tablet   Other Relevant Orders   Lipid panel   Hypertension, essential - Primary    Blood pressure on my check improved - 132/78.  She has concerns over lisinopril.  Wants to change to losartan.  This is what her husband takes.  Will change to losartan 25mg  q day.  Follow pressures.  Follow metabolic panel.        Relevant Medications   losartan (COZAAR) 25 MG tablet   Other Relevant Orders   CBC with Differential/Platelet   Comprehensive metabolic panel    Other Visit Diagnoses    Elevated TSH        Relevant Orders    TSH        Einar Pheasant, MD

## 2015-09-27 NOTE — Progress Notes (Signed)
Pre-visit discussion using our clinic review tool. No additional management support is needed unless otherwise documented below in the visit note.  

## 2015-10-09 ENCOUNTER — Encounter: Payer: Self-pay | Admitting: Internal Medicine

## 2015-10-09 DIAGNOSIS — R0989 Other specified symptoms and signs involving the circulatory and respiratory systems: Secondary | ICD-10-CM | POA: Insufficient documentation

## 2015-10-09 NOTE — Assessment & Plan Note (Signed)
Low cholesterol diet and exercise.  Follow lipid panel.   

## 2015-10-09 NOTE — Assessment & Plan Note (Signed)
Schedule abdominal ultrasound.

## 2015-10-09 NOTE — Assessment & Plan Note (Signed)
Blood pressure on my check improved - 132/78.  She has concerns over lisinopril.  Wants to change to losartan.  This is what her husband takes.  Will change to losartan 25mg  q day.  Follow pressures.  Follow metabolic panel.

## 2015-10-12 ENCOUNTER — Other Ambulatory Visit (INDEPENDENT_AMBULATORY_CARE_PROVIDER_SITE_OTHER): Payer: Medicare Other

## 2015-10-12 DIAGNOSIS — E7800 Pure hypercholesterolemia, unspecified: Secondary | ICD-10-CM | POA: Diagnosis not present

## 2015-10-12 DIAGNOSIS — R946 Abnormal results of thyroid function studies: Secondary | ICD-10-CM

## 2015-10-12 DIAGNOSIS — R7989 Other specified abnormal findings of blood chemistry: Secondary | ICD-10-CM

## 2015-10-12 DIAGNOSIS — I10 Essential (primary) hypertension: Secondary | ICD-10-CM | POA: Diagnosis not present

## 2015-10-12 LAB — COMPREHENSIVE METABOLIC PANEL
ALBUMIN: 4.5 g/dL (ref 3.5–5.2)
ALT: 10 U/L (ref 0–35)
AST: 10 U/L (ref 0–37)
Alkaline Phosphatase: 61 U/L (ref 39–117)
BUN: 21 mg/dL (ref 6–23)
CALCIUM: 9.7 mg/dL (ref 8.4–10.5)
CHLORIDE: 104 meq/L (ref 96–112)
CO2: 26 meq/L (ref 19–32)
Creatinine, Ser: 1.09 mg/dL (ref 0.40–1.20)
GFR: 52.92 mL/min — AB (ref >60.00)
Glucose, Bld: 87 mg/dL (ref 70–99)
POTASSIUM: 5.3 meq/L — AB (ref 3.5–5.1)
Sodium: 140 mEq/L (ref 135–145)
Total Bilirubin: 0.7 mg/dL (ref 0.2–1.2)
Total Protein: 6.6 g/dL (ref 6.0–8.3)

## 2015-10-12 LAB — LIPID PANEL
CHOLESTEROL: 274 mg/dL — AB (ref 0–200)
HDL: 70.7 mg/dL (ref >39.00)
LDL Cholesterol: 181 mg/dL — ABNORMAL HIGH (ref 0–99)
NonHDL: 203.47
TRIGLYCERIDES: 110 mg/dL (ref 0.0–149.0)
Total CHOL/HDL Ratio: 4
VLDL: 22 mg/dL (ref 0.0–40.0)

## 2015-10-12 LAB — CBC WITH DIFFERENTIAL/PLATELET
BASOS PCT: 0.8 % (ref 0.0–3.0)
Basophils Absolute: 0 10*3/uL (ref 0.0–0.1)
EOS ABS: 0.4 10*3/uL (ref 0.0–0.7)
Eosinophils Relative: 7.9 % — ABNORMAL HIGH (ref 0.0–5.0)
HCT: 39.7 % (ref 36.0–46.0)
HEMOGLOBIN: 13.6 g/dL (ref 12.0–15.0)
LYMPHS ABS: 1.8 10*3/uL (ref 0.7–4.0)
Lymphocytes Relative: 31.9 % (ref 12.0–46.0)
MCHC: 34.1 g/dL (ref 30.0–36.0)
MCV: 88.7 fl (ref 78.0–100.0)
MONO ABS: 0.4 10*3/uL (ref 0.1–1.0)
Monocytes Relative: 7.6 % (ref 3.0–12.0)
NEUTROS ABS: 2.9 10*3/uL (ref 1.4–7.7)
NEUTROS PCT: 51.8 % (ref 43.0–77.0)
Platelets: 359 10*3/uL (ref 150.0–400.0)
RBC: 4.48 Mil/uL (ref 3.87–5.11)
RDW: 13.6 % (ref 11.5–15.5)
WBC: 5.6 10*3/uL (ref 4.0–10.5)

## 2015-10-12 LAB — TSH: TSH: 4 u[IU]/mL (ref 0.35–4.50)

## 2015-10-16 ENCOUNTER — Ambulatory Visit: Payer: Medicare Other

## 2015-10-17 ENCOUNTER — Other Ambulatory Visit
Admission: RE | Admit: 2015-10-17 | Discharge: 2015-10-17 | Disposition: A | Discharge status: Home / Self Care | Payer: Medicare Other | Source: Ambulatory Visit | Attending: Internal Medicine | Admitting: Internal Medicine

## 2015-10-17 ENCOUNTER — Telehealth: Payer: Self-pay | Admitting: *Deleted

## 2015-10-17 ENCOUNTER — Other Ambulatory Visit: Payer: Self-pay | Admitting: Internal Medicine

## 2015-10-17 ENCOUNTER — Encounter: Payer: Self-pay | Admitting: Internal Medicine

## 2015-10-17 DIAGNOSIS — E875 Hyperkalemia: Secondary | ICD-10-CM | POA: Insufficient documentation

## 2015-10-17 DIAGNOSIS — I10 Essential (primary) hypertension: Secondary | ICD-10-CM

## 2015-10-17 LAB — BASIC METABOLIC PANEL
ANION GAP: 7 (ref 5–15)
BUN: 22 mg/dL — ABNORMAL HIGH (ref 6–20)
CALCIUM: 10 mg/dL (ref 8.9–10.3)
CO2: 26 mmol/L (ref 22–32)
CREATININE: 1.08 mg/dL — AB (ref 0.44–1.00)
Chloride: 106 mmol/L (ref 101–111)
GFR, EST AFRICAN AMERICAN: 60 mL/min — AB (ref >60)
GFR, EST NON AFRICAN AMERICAN: 52 mL/min — AB (ref >60)
Glucose, Bld: 108 mg/dL — ABNORMAL HIGH (ref 65–99)
POTASSIUM: 4.5 mmol/L (ref 3.5–5.1)
Sodium: 139 mmol/L (ref 135–145)

## 2015-10-17 MED ORDER — ROSUVASTATIN CALCIUM 5 MG PO TABS: ORAL_TABLET | ORAL | Status: DC

## 2015-10-17 NOTE — Progress Notes (Signed)
Order placed for met b 

## 2015-10-17 NOTE — Telephone Encounter (Signed)
If blood pressure is elevated, have her increase her losartan to 50mg  q day.  Spot check her pressure and send in.  Let me know if any other problems and confirm nothing more acute going on now.

## 2015-10-18 ENCOUNTER — Encounter: Payer: Self-pay | Admitting: Internal Medicine

## 2015-10-18 NOTE — Telephone Encounter (Signed)
Opened in error

## 2015-10-18 NOTE — Telephone Encounter (Signed)
Pt notified when I discussed lab results with her yesterday.

## 2015-10-19 ENCOUNTER — Telehealth: Payer: Self-pay | Admitting: *Deleted

## 2015-10-19 MED ORDER — LOSARTAN POTASSIUM 50 MG PO TABS: 50.0000 mg | ORAL_TABLET | Freq: Every day | ORAL | Status: DC

## 2015-10-19 NOTE — Telephone Encounter (Signed)
Refill sent at 50mg .  thanks

## 2015-10-19 NOTE — Telephone Encounter (Signed)
Patient stated that her lasartan doses was changed to 50 mg, and requested a refill sent over to [facility]

## 2015-10-23 ENCOUNTER — Telehealth: Payer: Self-pay | Admitting: *Deleted

## 2015-10-23 ENCOUNTER — Ambulatory Visit (HOSPITAL_BASED_OUTPATIENT_CLINIC_OR_DEPARTMENT_OTHER)
Admission: RE | Admit: 2015-10-23 | Discharge: 2015-10-23 | Disposition: A | Discharge status: Home / Self Care | Payer: Medicare Other | Source: Ambulatory Visit | Attending: Family Medicine | Admitting: Family Medicine

## 2015-10-23 ENCOUNTER — Ambulatory Visit (HOSPITAL_COMMUNITY)
Admission: RE | Admit: 2015-10-23 | Discharge: 2015-10-23 | Disposition: A | Discharge status: Home / Self Care | Payer: Medicare Other | Source: Ambulatory Visit | Attending: Internal Medicine | Admitting: Internal Medicine

## 2015-10-23 ENCOUNTER — Other Ambulatory Visit: Payer: Self-pay | Admitting: Family Medicine

## 2015-10-23 DIAGNOSIS — I10 Essential (primary) hypertension: Secondary | ICD-10-CM

## 2015-10-23 DIAGNOSIS — R0989 Other specified symptoms and signs involving the circulatory and respiratory systems: Secondary | ICD-10-CM

## 2015-10-23 DIAGNOSIS — I701 Atherosclerosis of renal artery: Secondary | ICD-10-CM | POA: Insufficient documentation

## 2015-10-23 NOTE — Progress Notes (Signed)
*  PRELIMINARY RESULTS* Vascular Ultrasound Renal Artery Duplex has been completed.  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 Left renal artery no significant stenosis. No evidence of AAA.   Landry Mellow, RDMS, RVT  10/23/2015, 10:15 AM

## 2015-10-23 NOTE — Telephone Encounter (Signed)
Maco from [facility] has this pt scheduled for today, however, their department do not test for renal artery. Please call  Neomia Dear [phone] to clarify this order.

## 2015-10-30 NOTE — Progress Notes (Signed)
Pt notified of results.  Refer to vascular surgery.  She is in agreement.  Wants to go to [facility].

## 2015-11-06 ENCOUNTER — Encounter: Payer: Self-pay | Admitting: Internal Medicine

## 2015-11-06 ENCOUNTER — Telehealth: Payer: Self-pay | Admitting: Internal Medicine

## 2015-11-06 DIAGNOSIS — I701 Atherosclerosis of renal artery: Secondary | ICD-10-CM

## 2015-11-06 NOTE — Telephone Encounter (Signed)
I spoke to pt on the phone regarding her renal artery ultrasound and findings.  She agreed to referral to vascular surgery for evaluation.  Order placed for referral.  Wants to be seen in [place].

## 2015-11-09 ENCOUNTER — Encounter: Payer: Self-pay | Admitting: Vascular Surgery

## 2015-11-16 ENCOUNTER — Encounter: Payer: Medicare Other | Admitting: Vascular Surgery

## 2015-11-27 ENCOUNTER — Telehealth: Payer: Self-pay

## 2015-11-27 DIAGNOSIS — E7800 Pure hypercholesterolemia, unspecified: Secondary | ICD-10-CM

## 2015-11-27 DIAGNOSIS — I10 Essential (primary) hypertension: Secondary | ICD-10-CM

## 2015-11-27 NOTE — Telephone Encounter (Signed)
Pt coming for blood work on 11/29/15. Just confirming all we need to draw for is LFT's. Thank you.

## 2015-11-28 NOTE — Telephone Encounter (Signed)
Orders placed for labs

## 2015-11-28 NOTE — Telephone Encounter (Signed)
Orders placed for labs.  Thanks.

## 2015-11-28 NOTE — Telephone Encounter (Signed)
Noted! Thank you

## 2015-11-29 ENCOUNTER — Encounter: Payer: Self-pay | Admitting: Internal Medicine

## 2015-11-29 ENCOUNTER — Other Ambulatory Visit (INDEPENDENT_AMBULATORY_CARE_PROVIDER_SITE_OTHER): Payer: Medicare Other

## 2015-11-29 DIAGNOSIS — E7800 Pure hypercholesterolemia, unspecified: Secondary | ICD-10-CM | POA: Diagnosis not present

## 2015-11-29 DIAGNOSIS — I10 Essential (primary) hypertension: Secondary | ICD-10-CM

## 2015-11-29 LAB — BASIC METABOLIC PANEL
BUN: 24 mg/dL — ABNORMAL HIGH (ref 6–23)
CALCIUM: 10 mg/dL (ref 8.4–10.5)
CHLORIDE: 104 meq/L (ref 96–112)
CO2: 25 meq/L (ref 19–32)
Creatinine, Ser: 1.02 mg/dL (ref 0.40–1.20)
GFR: 57.11 mL/min — ABNORMAL LOW (ref >60.00)
Glucose, Bld: 122 mg/dL — ABNORMAL HIGH (ref 70–99)
Potassium: 4.4 mEq/L (ref 3.5–5.1)
SODIUM: 137 meq/L (ref 135–145)

## 2015-11-29 LAB — HEPATIC FUNCTION PANEL
ALK PHOS: 70 U/L (ref 39–117)
ALT: 12 U/L (ref 0–35)
AST: 13 U/L (ref 0–37)
Albumin: 4.4 g/dL (ref 3.5–5.2)
BILIRUBIN DIRECT: 0.1 mg/dL (ref 0.0–0.3)
BILIRUBIN TOTAL: 0.6 mg/dL (ref 0.2–1.2)
TOTAL PROTEIN: 7.1 g/dL (ref 6.0–8.3)

## 2015-12-01 NOTE — Telephone Encounter (Signed)
Unread mychart message mailed to patient 

## 2015-12-08 ENCOUNTER — Encounter: Payer: Self-pay | Admitting: Vascular Surgery

## 2015-12-08 ENCOUNTER — Encounter: Payer: Self-pay | Admitting: Internal Medicine

## 2015-12-09 MED ORDER — ROSUVASTATIN CALCIUM 5 MG PO TABS: ORAL_TABLET | ORAL | Status: DC

## 2015-12-09 NOTE — Telephone Encounter (Signed)
rx sent in for refill crestor #30 with 2 refills.

## 2015-12-12 NOTE — Telephone Encounter (Signed)
Left voicemail to inform pt of unread mychart message

## 2015-12-13 ENCOUNTER — Encounter: Payer: Self-pay | Admitting: Internal Medicine

## 2015-12-13 ENCOUNTER — Encounter: Payer: Self-pay | Admitting: Vascular Surgery

## 2015-12-13 ENCOUNTER — Ambulatory Visit (INDEPENDENT_AMBULATORY_CARE_PROVIDER_SITE_OTHER): Payer: Medicare Other | Admitting: Vascular Surgery

## 2015-12-13 VITALS — BP 149/81 | HR 73 | Temp 98.0°F | Resp 16 | Ht 67.0 in | Wt 162.0 lb

## 2015-12-13 DIAGNOSIS — I701 Atherosclerosis of renal artery: Secondary | ICD-10-CM

## 2015-12-13 DIAGNOSIS — I10 Essential (primary) hypertension: Secondary | ICD-10-CM | POA: Diagnosis not present

## 2015-12-13 NOTE — Progress Notes (Signed)
Referred by:  Einar Pheasant, MD [id number] [place], [place] [address]   Reason for referral: possible R RAS   History of Present Illness  Shelby Spencer is a 70 y.o. (02/08/1946) female who presents with chief complaint: possible R RAS.  This patient has history of HTN previously well controlled on ACE-I.  She was transitioned to a ARB given her ACE-I cough.  She began having some elevation in her medication dose and her PCP detect an abdominal bruit.  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  The patient denies any change in bladder habits and her anti-HTN regimen has remained limited.   Past Medical History  Diagnosis Date  . History of colon polyps   . History of prolapse of bladder   . History of pyloric stenosis   . Hypercholesterolemia     Past Surgical History  Procedure Laterality Date  . Cataract extraction, bilateral    . Tubal ligation      Social History   Social History  . Marital Status: Married    Spouse Name: N/A  . Number of Children: N/A  . Years of Education: N/A   Occupational History  . Not on file.   Social History Main Topics  . Smoking status: Never Smoker   . Smokeless tobacco: Never Used  . Alcohol Use: 1.8 oz/week    3 Glasses of wine per week  . Drug Use: No  . Sexual Activity: Not on file   Other Topics Concern  . Not on file   Social History Narrative    Family History  Problem Relation Age of Onset  . Hypertension Mother   . Heart disease Father     Current Outpatient Prescriptions  Medication Sig Dispense Refill  . Calcium Carbonate-Vitamin D (CALCIUM + D PO) Take by mouth.    . doxycycline (VIBRAMYCIN) 50 MG capsule Take 50 mg by mouth daily.  6  . losartan (COZAAR) 50 MG tablet Take 1 tablet (50 mg total) by mouth daily. 90 tablet 1  . rosuvastatin (CRESTOR) 5 MG tablet Take one tablet q day 30 tablet 2  . aspirin 81 MG tablet Take 81 mg by  mouth daily. Reported on 12/13/2015    . lisinopril (PRINIVIL,ZESTRIL) 10 MG tablet     . losartan (COZAAR) 25 MG tablet      Current Facility-Administered Medications  Medication Dose Route Frequency Provider Last Rate Last Dose  . 0.9 %  sodium chloride infusion  500 mL Intravenous Continuous Irene Shipper, MD         No Known Allergies   REVIEW OF SYSTEMS:  (Positives checked otherwise negative)  CARDIOVASCULAR:   [ ]  chest pain,  [ ]  chest pressure,  [ ]  palpitations,  [ ]  shortness of breath when laying flat,  [ ]  shortness of breath with exertion,   [ ]  pain in feet when walking,  [ ]  pain in feet when laying flat, [ ]  history of blood clot in veins (DVT),  [ ]  history of phlebitis,  [ ]  swelling in legs,  [ ]  varicose veins  PULMONARY:   [ ]  productive cough,  [ ]  asthma,  [ ]  wheezing  NEUROLOGIC:   [ ]  weakness in arms or legs,  [ ]  numbness in arms or legs,  [ ]  difficulty speaking or slurred speech,  [ ]  temporary loss of vision in one eye,  [ ]   dizziness  HEMATOLOGIC:   [ ]  bleeding problems,  [ ]  problems with blood clotting too easily  MUSCULOSKEL:   [ ]  joint pain, [ ]  joint swelling  GASTROINTEST:   [ ]  vomiting blood,  [ ]  blood in stool     GENITOURINARY:   [ ]  burning with urination,  [ ]  blood in urine  PSYCHIATRIC:   [ ]  history of major depression  INTEGUMENTARY:   [ ]  rashes,  [ ]  ulcers  CONSTITUTIONAL:   [ ]  fever,  [ ]  chills   Physical Examination  Filed Vitals:   12/13/15 1437 12/13/15 1438  BP: 153/85 149/81  Pulse: 74 73  Temp: 98 F (36.7 C)   Resp: 16   Height: 5\' 7"  (1.702 m)   Weight: 162 lb (73.483 kg)   SpO2: 100%    Body mass index is 25.37 kg/(m^2).  General: A&O x 3, WDWN  Head: [place]/AT  Ear/Nose/Throat: Hearing grossly intact, nares w/o erythema or drainage, oropharynx w/o Erythema/Exudate, Mallampati score: 3  Eyes: PERRLA, EOMI  Neck: Supple, no nuchal rigidity, no palpable  LAD  Pulmonary: Sym exp, good air movt, CTAB, no rales, rhonchi, & wheezing  Cardiac: RRR, Nl S1, S2, no Murmurs, rubs or gallops  Vascular: Vessel Right Left  Radial Palpable Palpable  Brachial Palpable Palpable  Carotid Palpable, without bruit Palpable, without bruit  Aorta Not palpable N/A  Femoral Palpable Palpable  Popliteal Not palpable Not palpable  PT Palpable Palpable  DP Palpable Palpable   Gastrointestinal: soft, NTND, no G/R, no HSM, no masses, no CVAT B  Musculoskeletal: M/S 5/5 throughout , Extremities without ischemic changes   Neurologic: CN 2-12 intact , Pain and light touch intact in extremities , Motor exam as listed above  Psychiatric: Judgment intact, Mood & affect appropriate for pt's clinical situation  Dermatologic: See M/S exam for extremity exam, no rashes otherwise noted  Lymph : No Cervical, Axillary, or Inguinal lymphadenopathy    Non-Invasive Vascular Imaging  B renal duplex (10/23/15) 1. Technically difficult due to overlying shadowing from ribs and  overlying bowel gas.  - RAR: right 4.19. left 1.88. - Bilateral normal intrarenal resistive indices. - Right Mid renal artery stenosis of >60%. Due to fibromuscular  dysplasia versus tortuosity of vessel.  Left renal artery no significant stenosis identified. - No evidence of abdominal aortic aneurysm.   Outside Studies/Documentation 4 pages of outside documents were reviewed including: outpatient PCP chart, outside renal duplex.   Medical Decision Making  Shelby Spencer is a 70 y.o. female who presents with: HTN, possible R renal artery stenosis >60%.   In general, renal duplex is a technically demanding study.  The general disclaimer at the start of the study also make me somewhat reluctant to take it at face value.  Unfortunately, I don't have access to the raw images to review the study.    In this patient with relatively symmetric kidney sizes without recalcitrant HTN on  multiple agents, I doubt she has significant renovascular HTN.  My low pre-test suspicion lowers the potential benefit-to-risk analysis for proceeding with definitive renal angiography or even CTA abd/pelvis given the nephrotoxic contrast load for either study.  Both the ASTRAL and CORAL trials have greatly limited the use of RA stenting today.    In this pt, I don't see a clear indication for renal angiography or stenting.  I discussed in depth with the patient the nature of atherosclerosis, and emphasized the importance of maximal medical management  including strict control of blood pressure, blood glucose, and lipid levels, antiplatelet agents, obtaining regular exercise, and cessation of smoking.    The patient is aware that without maximal medical management the underlying atherosclerotic disease process will progress, limiting the benefit of any interventions. The patient is currently on a statin: Crestor. physician.   The patient is currently on an anti-platelet: ASA.  I would repeat the B renal duplex in 6-12 months to see if any progression in the possible R renal artery stenosis.  If her HTN control worsens, re-evaluation would also be indicated.  Thank you for allowing [place] to participate in this patient's care.   Adele Barthel, MD [facility] Office: [phone] Pager: [phone]  12/13/2015, 5:16 PM

## 2015-12-14 ENCOUNTER — Ambulatory Visit (INDEPENDENT_AMBULATORY_CARE_PROVIDER_SITE_OTHER): Payer: Medicare Other | Admitting: Internal Medicine

## 2015-12-14 ENCOUNTER — Encounter: Payer: Self-pay | Admitting: Internal Medicine

## 2015-12-14 VITALS — BP 122/70 | HR 69 | Temp 97.7°F | Resp 18 | Ht 67.0 in | Wt 163.0 lb

## 2015-12-14 DIAGNOSIS — I10 Essential (primary) hypertension: Secondary | ICD-10-CM

## 2015-12-14 DIAGNOSIS — I701 Atherosclerosis of renal artery: Secondary | ICD-10-CM

## 2015-12-14 DIAGNOSIS — E7800 Pure hypercholesterolemia, unspecified: Secondary | ICD-10-CM

## 2015-12-14 MED ORDER — LOSARTAN POTASSIUM 100 MG PO TABS
100.0000 mg | ORAL_TABLET | Freq: Every day | ORAL | Status: DC
Start: 2015-12-14 — End: 2016-07-08

## 2015-12-14 NOTE — Progress Notes (Signed)
Patient ID: Shelby Spencer, female   DOB: 07/02/1945, 70 y.o.   MRN: [id number]   Subjective:    Patient ID: Shelby Spencer, female    DOB: May 21, 1946, 70 y.o.   MRN: [id number]  HPI  Patient here for a scheduled follow up.  Just evaluated by vascular surgery (Dr Bridgett Larsson) - for possible renal artery stenosis.  See his note.  No further w/up warranted at this time.  Planning for f/u in 1 year.  Her blood pressure has been remaining elevated - 140s.  Taking losartan and tolerating.  Stays active.  No cardiac symptoms with increased activity or exertion.  No sob.  No abdominal pain or cramping.  Bowels stable.     Past Medical History  Diagnosis Date  . History of colon polyps   . History of prolapse of bladder   . History of pyloric stenosis   . Hypercholesterolemia    Past Surgical History  Procedure Laterality Date  . Cataract extraction, bilateral    . Tubal ligation     Family History  Problem Relation Age of Onset  . Hypertension Mother   . Heart disease Father    Social History   Social History  . Marital Status: Married    Spouse Name: N/A  . Number of Children: N/A  . Years of Education: N/A   Social History Main Topics  . Smoking status: Never Smoker   . Smokeless tobacco: Never Used  . Alcohol Use: 1.8 oz/week    3 Glasses of wine per week  . Drug Use: No  . Sexual Activity: Not Asked   Other Topics Concern  . None   Social History Narrative    Outpatient Encounter Prescriptions as of 12/14/2015  Medication Sig  . aspirin 81 MG tablet Take 81 mg by mouth daily. Reported on 12/13/2015  . Calcium Carbonate-Vitamin D (CALCIUM + D PO) Take by mouth.  . doxycycline (VIBRAMYCIN) 50 MG capsule Take 50 mg by mouth daily.  . rosuvastatin (CRESTOR) 5 MG tablet Take one tablet q day (Patient taking differently: Three times a week)  . [DISCONTINUED] losartan (COZAAR) 50 MG tablet Take 1 tablet (50 mg total) by mouth daily.  Marland Kitchen losartan (COZAAR) 100 MG tablet Take 1 tablet  (100 mg total) by mouth daily.  . [DISCONTINUED] lisinopril (PRINIVIL,ZESTRIL) 10 MG tablet   . [DISCONTINUED] losartan (COZAAR) 25 MG tablet    Facility-Administered Encounter Medications as of 12/14/2015  Medication  . 0.9 %  sodium chloride infusion    Review of Systems  Constitutional: Negative for appetite change and unexpected weight change.  HENT: Negative for congestion and sinus pressure.   Respiratory: Negative for cough, chest tightness and shortness of breath.   Cardiovascular: Negative for chest pain, palpitations and leg swelling.  Gastrointestinal: Negative for nausea, vomiting, abdominal pain and diarrhea.  Genitourinary: Negative for dysuria and difficulty urinating.  Musculoskeletal: Negative for back pain and joint swelling.  Skin: Negative for color change and rash.  Neurological: Negative for dizziness, light-headedness and headaches.  Psychiatric/Behavioral: Negative for dysphoric mood and agitation.       Objective:    Physical Exam  Constitutional: She appears well-developed and well-nourished. No distress.  HENT:  Nose: Nose normal.  Mouth/Throat: Oropharynx is clear and moist.  Neck: Neck supple. No thyromegaly present.  Cardiovascular: Normal rate and regular rhythm.   Pulmonary/Chest: Breath sounds normal. No respiratory distress. She has no wheezes.  Abdominal: Soft. Bowel sounds are normal. There is no tenderness.  Abdominal bruit present.   Musculoskeletal: She exhibits no edema or tenderness.  Lymphadenopathy:    She has no cervical adenopathy.  Skin: No rash noted. No erythema.  Psychiatric: She has a normal mood and affect. Her behavior is normal.    BP 122/70 mmHg  Pulse 69  Temp(Src) 97.7 F (36.5 C) (Oral)  Resp 18  Ht 5\' 7"  (1.702 m)  Wt 163 lb (73.936 kg)  BMI 25.52 kg/m2  SpO2 98% Wt Readings from Last 3 Encounters:  12/14/15 163 lb (73.936 kg)  12/13/15 162 lb (73.483 kg)  09/27/15 163 lb 6 oz (74.106 kg)     Lab  Results  Component Value Date   WBC 5.6 10/12/2015   HGB 13.6 10/12/2015   HCT 39.7 10/12/2015   PLT 359.0 10/12/2015   GLUCOSE 122* 11/29/2015   CHOL 274* 10/12/2015   TRIG 110.0 10/12/2015   HDL 70.70 10/12/2015   LDLCALC 181* 10/12/2015   ALT 12 11/29/2015   AST 13 11/29/2015   NA 137 11/29/2015   K 4.4 11/29/2015   CL 104 11/29/2015   CREATININE 1.02 11/29/2015   BUN 24* 11/29/2015   CO2 25 11/29/2015   TSH 4.00 10/12/2015       Assessment & Plan:   Problem List Items Addressed This Visit    Hypercholesterolemia    On crestor.  Low cholesterol diet and exercise.  Follow lipid panel and liver function tests.        Relevant Medications   losartan (COZAAR) 100 MG tablet   Other Relevant Orders   Lipid panel   Hepatic function panel   Hypertension, essential    Persistent elevation as outlined.  Increase losartan to 100mg  q day.  Follow pressures.  Follow metabolic panel.        Relevant Medications   losartan (COZAAR) 100 MG tablet   Other Relevant Orders   Basic metabolic panel   Renal artery stenosis ([place]) - Primary    Saw [place] [facility] (Dr Bridgett Larsson).  See note.  Recommended f/u duplex in 6-12 months.        Relevant Medications   losartan (COZAAR) 100 MG tablet       Einar Pheasant, MD

## 2015-12-14 NOTE — Progress Notes (Signed)
Pre-visit discussion using our clinic review tool. No additional management support is needed unless otherwise documented below in the visit note.  

## 2015-12-17 ENCOUNTER — Encounter: Payer: Self-pay | Admitting: Internal Medicine

## 2015-12-17 NOTE — Assessment & Plan Note (Signed)
Saw [place] [facility] (Dr Bridgett Larsson).  See note.  Recommended f/u duplex in 6-12 months.

## 2015-12-17 NOTE — Assessment & Plan Note (Signed)
On crestor.  Low cholesterol diet and exercise.  Follow lipid panel and liver function tests.   

## 2015-12-17 NOTE — Assessment & Plan Note (Signed)
Persistent elevation as outlined.  Increase losartan to 100mg  q day.  Follow pressures.  Follow metabolic panel.

## 2016-01-01 ENCOUNTER — Encounter: Payer: Self-pay | Admitting: Internal Medicine

## 2016-01-01 NOTE — Telephone Encounter (Signed)
Please call pt and see if she can come in tomorrow at 9:00 to discuss her blood pressure and reevaluate.  Have her bring her blood pressure cuff.  Block 30 minutes.

## 2016-01-02 ENCOUNTER — Ambulatory Visit (INDEPENDENT_AMBULATORY_CARE_PROVIDER_SITE_OTHER): Payer: Medicare Other | Admitting: Internal Medicine

## 2016-01-02 ENCOUNTER — Encounter: Payer: Self-pay | Admitting: Internal Medicine

## 2016-01-02 VITALS — BP 128/80 | HR 79 | Temp 97.5°F | Resp 18 | Ht 67.0 in | Wt 163.1 lb

## 2016-01-02 DIAGNOSIS — I10 Essential (primary) hypertension: Secondary | ICD-10-CM

## 2016-01-02 DIAGNOSIS — I701 Atherosclerosis of renal artery: Secondary | ICD-10-CM | POA: Diagnosis not present

## 2016-01-02 LAB — BASIC METABOLIC PANEL
BUN: 22 mg/dL (ref 6–23)
CALCIUM: 10 mg/dL (ref 8.4–10.5)
CO2: 27 mEq/L (ref 19–32)
CREATININE: 1.22 mg/dL — AB (ref 0.40–1.20)
Chloride: 104 mEq/L (ref 96–112)
GFR: 46.44 mL/min — AB (ref >60.00)
GLUCOSE: 88 mg/dL (ref 70–99)
Potassium: 4.2 mEq/L (ref 3.5–5.1)
Sodium: 139 mEq/L (ref 135–145)

## 2016-01-02 MED ORDER — AMLODIPINE BESYLATE 5 MG PO TABS: 5.0000 mg | ORAL_TABLET | Freq: Every day | ORAL | 1 refills | Status: DC

## 2016-01-02 NOTE — Progress Notes (Signed)
Patient ID: Shelby Spencer, female   DOB: 07-Feb-1946, 70 y.o.   MRN: [id number]   Subjective:    Patient ID: Shelby Spencer, female    DOB: 08-May-1946, 70 y.o.   MRN: [id number]  HPI  Patient here as a work in to discuss her blood pressure.  We increased her losartan to 100mg  last visit.  She has been monitoring her blood pressure and has been averaging [id number] systolic (on outside checks).  Brought in her cuff today.  On outside checks, blood pressure averaging A999333 systolic readings.  She feels good.  No chest pain.  No sob.  Stays active.  Eating and drinking well.     Past Medical History:  Diagnosis Date  . History of colon polyps   . History of prolapse of bladder   . History of pyloric stenosis   . Hypercholesterolemia    Past Surgical History:  Procedure Laterality Date  . CATARACT EXTRACTION, BILATERAL    . TUBAL LIGATION     Family History  Problem Relation Age of Onset  . Hypertension Mother   . Heart disease Father    Social History   Social History  . Marital status: Married    Spouse name: N/A  . Number of children: N/A  . Years of education: N/A   Social History Main Topics  . Smoking status: Never Smoker  . Smokeless tobacco: Never Used  . Alcohol use 1.8 oz/week    3 Glasses of wine per week  . Drug use: No  . Sexual activity: Not Asked   Other Topics Concern  . None   Social History Narrative  . None    Outpatient Encounter Prescriptions as of 01/02/2016  Medication Sig  . aspirin 81 MG tablet Take 81 mg by mouth daily. Reported on 12/13/2015  . Calcium Carbonate-Vitamin D (CALCIUM + D PO) Take by mouth.  . doxycycline (VIBRAMYCIN) 50 MG capsule Take 50 mg by mouth daily.  Marland Kitchen losartan (COZAAR) 100 MG tablet Take 1 tablet (100 mg total) by mouth daily.  . rosuvastatin (CRESTOR) 5 MG tablet Take one tablet q day (Patient taking differently: Take 5 mg by mouth. Three times a week (every Monday, Wednesday, & Friday))  . amLODipine (NORVASC) 5 MG tablet  Take 1 tablet (5 mg total) by mouth daily.   Facility-Administered Encounter Medications as of 01/02/2016  Medication  . 0.9 %  sodium chloride infusion    Review of Systems  Constitutional: Negative for appetite change and unexpected weight change.  HENT: Negative for congestion and sinus pressure.   Respiratory: Negative for cough, chest tightness and shortness of breath.   Cardiovascular: Negative for chest pain, palpitations and leg swelling.  Skin: Negative for color change and rash.  Neurological: Negative for dizziness, light-headedness and headaches.  Psychiatric/Behavioral: Negative for agitation and dysphoric mood.       Objective:     Blood pressure rechecked by me:  146-148/78-82 and does appear to correlate with her cuff.    Physical Exam  Constitutional: She appears well-developed and well-nourished. No distress.  HENT:  Nose: Nose normal.  Mouth/Throat: Oropharynx is clear and moist.  Neck: Neck supple. No thyromegaly present.  Cardiovascular: Normal rate and regular rhythm.   Pulmonary/Chest: Breath sounds normal. No respiratory distress. She has no wheezes.  Abdominal: Soft. Bowel sounds are normal. There is no tenderness.  Musculoskeletal: She exhibits no edema or tenderness.  Lymphadenopathy:    She has no cervical adenopathy.  Skin: No rash  noted. No erythema.  Psychiatric: She has a normal mood and affect. Her behavior is normal.    BP 128/80 (BP Location: Left Arm, Patient Position: Sitting, Cuff Size: Normal) Comment: BP was 151/83 with home BP cuff (same arm)  Pulse 79   Temp 97.5 F (36.4 C) (Oral)   Resp 18   Ht 5\' 7"  (1.702 m)   Wt 163 lb 2 oz (74 kg)   SpO2 98%   BMI 25.55 kg/m  Wt Readings from Last 3 Encounters:  01/02/16 163 lb 2 oz (74 kg)  12/14/15 163 lb (73.9 kg)  12/13/15 162 lb (73.5 kg)     Lab Results  Component Value Date   WBC 5.6 10/12/2015   HGB 13.6 10/12/2015   HCT 39.7 10/12/2015   PLT 359.0 10/12/2015   GLUCOSE  88 01/02/2016   CHOL 274 (H) 10/12/2015   TRIG 110.0 10/12/2015   HDL 70.70 10/12/2015   LDLCALC 181 (H) 10/12/2015   ALT 12 11/29/2015   AST 13 11/29/2015   NA 139 01/02/2016   K 4.2 01/02/2016   CL 104 01/02/2016   CREATININE 1.22 (H) 01/02/2016   BUN 22 01/02/2016   CO2 27 01/02/2016   TSH 4.00 10/12/2015       Assessment & Plan:   Problem List Items Addressed This Visit    Hypertension, essential - Primary    Blood pressure as outlined.  Still elevated.  Add amlodipine 5mg  q day.  Follow pressures.  Follow metabolic panel.  Recheck today to confirm no change in renal function.       Relevant Medications   amLODipine (NORVASC) 5 MG tablet   Other Relevant Orders   Basic metabolic panel (Completed)   Renal artery stenosis ([place])    Saw vascular surgery (Dr Bridgett Larsson).  Blood pressure as outlined.  Add amlodipine.  Follow pressures.  Recheck metabolic panel today.        Relevant Medications   amLODipine (NORVASC) 5 MG tablet    Other Visit Diagnoses   None.      Einar Pheasant, MD

## 2016-01-02 NOTE — Progress Notes (Signed)
Pre-visit discussion using our clinic review tool. No additional management support is needed unless otherwise documented below in the visit note.  

## 2016-01-03 ENCOUNTER — Other Ambulatory Visit: Payer: Self-pay | Admitting: Internal Medicine

## 2016-01-03 ENCOUNTER — Encounter: Payer: Self-pay | Admitting: Internal Medicine

## 2016-01-03 DIAGNOSIS — R7989 Other specified abnormal findings of blood chemistry: Secondary | ICD-10-CM

## 2016-01-07 ENCOUNTER — Encounter: Payer: Self-pay | Admitting: Internal Medicine

## 2016-01-07 NOTE — Assessment & Plan Note (Signed)
Saw vascular surgery (Dr Bridgett Larsson).  Blood pressure as outlined.  Add amlodipine.  Follow pressures.  Recheck metabolic panel today.

## 2016-01-07 NOTE — Assessment & Plan Note (Signed)
Blood pressure as outlined.  Still elevated.  Add amlodipine 5mg  q day.  Follow pressures.  Follow metabolic panel.  Recheck today to confirm no change in renal function.

## 2016-01-09 ENCOUNTER — Other Ambulatory Visit (INDEPENDENT_AMBULATORY_CARE_PROVIDER_SITE_OTHER): Payer: Medicare Other

## 2016-01-09 DIAGNOSIS — R7989 Other specified abnormal findings of blood chemistry: Secondary | ICD-10-CM

## 2016-01-09 DIAGNOSIS — R748 Abnormal levels of other serum enzymes: Secondary | ICD-10-CM

## 2016-01-09 LAB — BASIC METABOLIC PANEL
BUN: 26 mg/dL — ABNORMAL HIGH (ref 6–23)
CHLORIDE: 103 meq/L (ref 96–112)
CO2: 27 meq/L (ref 19–32)
Calcium: 9.9 mg/dL (ref 8.4–10.5)
Creatinine, Ser: 1.18 mg/dL (ref 0.40–1.20)
GFR: 48.26 mL/min — ABNORMAL LOW (ref >60.00)
Glucose, Bld: 92 mg/dL (ref 70–99)
POTASSIUM: 4.4 meq/L (ref 3.5–5.1)
SODIUM: 138 meq/L (ref 135–145)

## 2016-01-10 ENCOUNTER — Encounter: Payer: Self-pay | Admitting: Internal Medicine

## 2016-01-24 ENCOUNTER — Encounter: Payer: Self-pay | Admitting: Internal Medicine

## 2016-02-07 ENCOUNTER — Other Ambulatory Visit: Payer: Self-pay | Admitting: Internal Medicine

## 2016-02-19 DIAGNOSIS — Z23 Encounter for immunization: Secondary | ICD-10-CM | POA: Diagnosis not present

## 2016-03-04 ENCOUNTER — Other Ambulatory Visit (INDEPENDENT_AMBULATORY_CARE_PROVIDER_SITE_OTHER): Payer: Medicare Other

## 2016-03-04 DIAGNOSIS — E7800 Pure hypercholesterolemia, unspecified: Secondary | ICD-10-CM

## 2016-03-04 DIAGNOSIS — I10 Essential (primary) hypertension: Secondary | ICD-10-CM

## 2016-03-04 LAB — LIPID PANEL
CHOLESTEROL: 221 mg/dL — AB (ref 0–200)
HDL: 86 mg/dL (ref >39.00)
LDL Cholesterol: 111 mg/dL — ABNORMAL HIGH (ref 0–99)
NonHDL: 134.95
TRIGLYCERIDES: 118 mg/dL (ref 0.0–149.0)
Total CHOL/HDL Ratio: 3
VLDL: 23.6 mg/dL (ref 0.0–40.0)

## 2016-03-04 LAB — HEPATIC FUNCTION PANEL
ALBUMIN: 4.1 g/dL (ref 3.5–5.2)
ALK PHOS: 65 U/L (ref 39–117)
ALT: 15 U/L (ref 0–35)
AST: 12 U/L (ref 0–37)
BILIRUBIN DIRECT: 0.1 mg/dL (ref 0.0–0.3)
TOTAL PROTEIN: 7 g/dL (ref 6.0–8.3)
Total Bilirubin: 0.7 mg/dL (ref 0.2–1.2)

## 2016-03-04 LAB — BASIC METABOLIC PANEL
BUN: 23 mg/dL (ref 6–23)
CALCIUM: 9.2 mg/dL (ref 8.4–10.5)
CO2: 26 mEq/L (ref 19–32)
CREATININE: 1.06 mg/dL (ref 0.40–1.20)
Chloride: 104 mEq/L (ref 96–112)
GFR: 54.59 mL/min — AB (ref >60.00)
Glucose, Bld: 92 mg/dL (ref 70–99)
Potassium: 4.5 mEq/L (ref 3.5–5.1)
Sodium: 138 mEq/L (ref 135–145)

## 2016-03-05 ENCOUNTER — Encounter: Payer: Self-pay | Admitting: Internal Medicine

## 2016-03-11 ENCOUNTER — Ambulatory Visit: Payer: Medicare Other | Admitting: Internal Medicine

## 2016-03-12 ENCOUNTER — Ambulatory Visit (INDEPENDENT_AMBULATORY_CARE_PROVIDER_SITE_OTHER): Payer: Medicare Other | Admitting: Internal Medicine

## 2016-03-12 ENCOUNTER — Encounter: Payer: Self-pay | Admitting: Internal Medicine

## 2016-03-12 DIAGNOSIS — I10 Essential (primary) hypertension: Secondary | ICD-10-CM

## 2016-03-12 DIAGNOSIS — I701 Atherosclerosis of renal artery: Secondary | ICD-10-CM | POA: Diagnosis not present

## 2016-03-12 DIAGNOSIS — E7800 Pure hypercholesterolemia, unspecified: Secondary | ICD-10-CM

## 2016-03-12 NOTE — Progress Notes (Signed)
Patient ID: Shelby Spencer, female   DOB: April 21, 1946, 70 y.o.   MRN: [id number]   Subjective:    Patient ID: Shelby Spencer, female    DOB: 08/25/1945, 70 y.o.   MRN: [id number]  HPI  Patient here for a scheduled follow up.   States she is doing well.  Feels good.  No chest pain.  Stays active.  No sob.  No acid reflux.  No abdominal pain or cramping.  Bowels stable.  Taking her blood pressure medication and tolerating.  States blood pressure is averaging [id number] systolic readings.    Past Medical History:  Diagnosis Date  . History of colon polyps   . History of prolapse of bladder   . History of pyloric stenosis   . Hypercholesterolemia    Past Surgical History:  Procedure Laterality Date  . CATARACT EXTRACTION, BILATERAL    . TUBAL LIGATION     Family History  Problem Relation Age of Onset  . Hypertension Mother   . Heart disease Father    Social History   Social History  . Marital status: Married    Spouse name: N/A  . Number of children: N/A  . Years of education: N/A   Social History Main Topics  . Smoking status: Never Smoker  . Smokeless tobacco: Never Used  . Alcohol use 1.8 oz/week    3 Glasses of wine per week  . Drug use: No  . Sexual activity: Not Asked   Other Topics Concern  . None   Social History Narrative  . None    Outpatient Encounter Prescriptions as of 03/12/2016  Medication Sig  . amLODipine (NORVASC) 5 MG tablet TAKE 1 TABLET BY MOUTH ONCE DAILY  . aspirin 81 MG tablet Take 81 mg by mouth daily. Reported on 12/13/2015  . Calcium Carbonate-Vitamin D (CALCIUM + D PO) Take by mouth.  . doxycycline (VIBRAMYCIN) 50 MG capsule Take 50 mg by mouth daily.  Marland Kitchen losartan (COZAAR) 100 MG tablet Take 1 tablet (100 mg total) by mouth daily.  . rosuvastatin (CRESTOR) 5 MG tablet Take one tablet q day (Patient taking differently: Take 5 mg by mouth. Three times a week (every Monday, Wednesday, & Friday))   Facility-Administered Encounter Medications as of  03/12/2016  Medication  . 0.9 %  sodium chloride infusion    Review of Systems  Constitutional: Negative for appetite change and unexpected weight change.  HENT: Negative for congestion and sinus pressure.   Respiratory: Negative for cough, chest tightness and shortness of breath.   Cardiovascular: Negative for chest pain, palpitations and leg swelling.  Gastrointestinal: Negative for abdominal pain, diarrhea, nausea and vomiting.  Genitourinary: Negative for difficulty urinating and dysuria.  Musculoskeletal: Negative for back pain and joint swelling.  Skin: Negative for color change and rash.  Neurological: Negative for dizziness, light-headedness and headaches.  Psychiatric/Behavioral: Negative for agitation and dysphoric mood.       Objective:     Blood pressure rechecked by me:  132-134/80-82  Physical Exam  Constitutional: She appears well-developed and well-nourished. No distress.  HENT:  Nose: Nose normal.  Mouth/Throat: Oropharynx is clear and moist.  Neck: Neck supple. No thyromegaly present.  Cardiovascular: Normal rate and regular rhythm.   Pulmonary/Chest: Breath sounds normal. No respiratory distress. She has no wheezes.  Abdominal: Soft. Bowel sounds are normal. There is no tenderness.  Musculoskeletal: She exhibits no edema or tenderness.  Lymphadenopathy:    She has no cervical adenopathy.  Skin: No rash noted.  No erythema.  Psychiatric: She has a normal mood and affect. Her behavior is normal.    BP (!) 148/80   Pulse 79   Temp 98.6 F (37 C) (Oral)   Wt 163 lb 9.6 oz (74.2 kg)   SpO2 99%   BMI 25.62 kg/m  Wt Readings from Last 3 Encounters:  03/12/16 163 lb 9.6 oz (74.2 kg)  01/02/16 163 lb 2 oz (74 kg)  12/14/15 163 lb (73.9 kg)     Lab Results  Component Value Date   WBC 5.6 10/12/2015   HGB 13.6 10/12/2015   HCT 39.7 10/12/2015   PLT 359.0 10/12/2015   GLUCOSE 92 03/04/2016   CHOL 221 (H) 03/04/2016   TRIG 118.0 03/04/2016   HDL  86.00 03/04/2016   LDLCALC 111 (H) 03/04/2016   ALT 15 03/04/2016   AST 12 03/04/2016   NA 138 03/04/2016   K 4.5 03/04/2016   CL 104 03/04/2016   CREATININE 1.06 03/04/2016   BUN 23 03/04/2016   CO2 26 03/04/2016   TSH 4.00 10/12/2015       Assessment & Plan:   Problem List Items Addressed This Visit    Hypercholesterolemia    On crestor.  Low cholesterol diet and exercise.  Cholesterol improved.  Follow lipid panel and liver function tests.        Relevant Orders   Lipid panel   Hepatic function panel   Hypertension, essential    Blood pressure as outlined.  Same medication regimen.  Follow metabolic panel.  Recent potassium wnl.  Creatinine stable.       Relevant Orders   Basic metabolic panel   Renal artery stenosis ([place])    Saw vascular surgery (Dr Bridgett Larsson).  Blood pressure doing better on current regimen.  Follow pressures.  Follow metabolic panel.         Other Visit Diagnoses   None.      Einar Pheasant, MD

## 2016-03-12 NOTE — Progress Notes (Signed)
Pre visit review using our clinic review tool, if applicable. No additional management support is needed unless otherwise documented below in the visit note. 

## 2016-03-17 ENCOUNTER — Encounter: Payer: Self-pay | Admitting: Internal Medicine

## 2016-03-17 NOTE — Assessment & Plan Note (Signed)
Blood pressure as outlined.  Same medication regimen.  Follow metabolic panel.  Recent potassium wnl.  Creatinine stable.

## 2016-03-17 NOTE — Assessment & Plan Note (Signed)
On crestor.  Low cholesterol diet and exercise.  Cholesterol improved.  Follow lipid panel and liver function tests.

## 2016-03-17 NOTE — Assessment & Plan Note (Signed)
Saw vascular surgery (Dr Bridgett Larsson).  Blood pressure doing better on current regimen.  Follow pressures.  Follow metabolic panel.

## 2016-04-29 ENCOUNTER — Encounter: Payer: Self-pay | Admitting: Internal Medicine

## 2016-04-29 DIAGNOSIS — Z1231 Encounter for screening mammogram for malignant neoplasm of breast: Secondary | ICD-10-CM | POA: Diagnosis not present

## 2016-04-29 DIAGNOSIS — M8589 Other specified disorders of bone density and structure, multiple sites: Secondary | ICD-10-CM | POA: Diagnosis not present

## 2016-04-29 LAB — HM MAMMOGRAPHY

## 2016-04-30 ENCOUNTER — Encounter: Payer: Self-pay | Admitting: Internal Medicine

## 2016-05-06 ENCOUNTER — Encounter: Payer: Self-pay | Admitting: Internal Medicine

## 2016-05-12 ENCOUNTER — Other Ambulatory Visit: Payer: Self-pay | Admitting: Internal Medicine

## 2016-05-13 ENCOUNTER — Telehealth: Payer: Self-pay

## 2016-05-13 NOTE — Telephone Encounter (Signed)
Pt was notified of bone density test results per Dr. Nicki Reaper., pt verbalized understanding.

## 2016-05-22 ENCOUNTER — Encounter: Payer: Self-pay | Admitting: Internal Medicine

## 2016-06-27 DIAGNOSIS — D2272 Melanocytic nevi of left lower limb, including hip: Secondary | ICD-10-CM | POA: Diagnosis not present

## 2016-06-27 DIAGNOSIS — D225 Melanocytic nevi of trunk: Secondary | ICD-10-CM | POA: Diagnosis not present

## 2016-06-27 DIAGNOSIS — D2261 Melanocytic nevi of right upper limb, including shoulder: Secondary | ICD-10-CM | POA: Diagnosis not present

## 2016-06-27 DIAGNOSIS — L821 Other seborrheic keratosis: Secondary | ICD-10-CM | POA: Diagnosis not present

## 2016-07-08 ENCOUNTER — Other Ambulatory Visit: Payer: Self-pay | Admitting: Internal Medicine

## 2016-07-16 ENCOUNTER — Other Ambulatory Visit (INDEPENDENT_AMBULATORY_CARE_PROVIDER_SITE_OTHER): Payer: Medicare Other

## 2016-07-16 DIAGNOSIS — I10 Essential (primary) hypertension: Secondary | ICD-10-CM | POA: Diagnosis not present

## 2016-07-16 DIAGNOSIS — E7800 Pure hypercholesterolemia, unspecified: Secondary | ICD-10-CM | POA: Diagnosis not present

## 2016-07-16 LAB — HEPATIC FUNCTION PANEL
ALK PHOS: 70 U/L (ref 39–117)
ALT: 12 U/L (ref 0–35)
AST: 13 U/L (ref 0–37)
Albumin: 4.5 g/dL (ref 3.5–5.2)
BILIRUBIN DIRECT: 0.1 mg/dL (ref 0.0–0.3)
BILIRUBIN TOTAL: 0.5 mg/dL (ref 0.2–1.2)
Total Protein: 7 g/dL (ref 6.0–8.3)

## 2016-07-16 LAB — BASIC METABOLIC PANEL
BUN: 23 mg/dL (ref 6–23)
CHLORIDE: 105 meq/L (ref 96–112)
CO2: 28 meq/L (ref 19–32)
Calcium: 9.6 mg/dL (ref 8.4–10.5)
Creatinine, Ser: 1.08 mg/dL (ref 0.40–1.20)
GFR: 53.37 mL/min — ABNORMAL LOW (ref >60.00)
GLUCOSE: 93 mg/dL (ref 70–99)
Potassium: 4.6 mEq/L (ref 3.5–5.1)
Sodium: 138 mEq/L (ref 135–145)

## 2016-07-16 LAB — LIPID PANEL
CHOL/HDL RATIO: 2
Cholesterol: 204 mg/dL — ABNORMAL HIGH (ref 0–200)
HDL: 87.5 mg/dL (ref >39.00)
LDL Cholesterol: 96 mg/dL (ref 0–99)
NONHDL: 116.37
TRIGLYCERIDES: 101 mg/dL (ref 0.0–149.0)
VLDL: 20.2 mg/dL (ref 0.0–40.0)

## 2016-07-17 ENCOUNTER — Encounter: Payer: Self-pay | Admitting: Internal Medicine

## 2016-07-18 ENCOUNTER — Encounter: Payer: Self-pay | Admitting: Internal Medicine

## 2016-07-18 ENCOUNTER — Ambulatory Visit (INDEPENDENT_AMBULATORY_CARE_PROVIDER_SITE_OTHER): Payer: Medicare Other | Admitting: Internal Medicine

## 2016-07-18 VITALS — BP 128/78 | HR 78 | Temp 98.6°F | Resp 18 | Ht 67.0 in | Wt 167.8 lb

## 2016-07-18 DIAGNOSIS — Z23 Encounter for immunization: Secondary | ICD-10-CM | POA: Diagnosis not present

## 2016-07-18 DIAGNOSIS — E7800 Pure hypercholesterolemia, unspecified: Secondary | ICD-10-CM

## 2016-07-18 DIAGNOSIS — I701 Atherosclerosis of renal artery: Secondary | ICD-10-CM | POA: Diagnosis not present

## 2016-07-18 DIAGNOSIS — I10 Essential (primary) hypertension: Secondary | ICD-10-CM | POA: Diagnosis not present

## 2016-07-18 MED ORDER — ROSUVASTATIN CALCIUM 5 MG PO TABS: ORAL_TABLET | ORAL | 1 refills | Status: DC

## 2016-07-18 MED ORDER — LOSARTAN POTASSIUM 100 MG PO TABS: 100.0000 mg | ORAL_TABLET | Freq: Every day | ORAL | 1 refills | Status: DC

## 2016-07-18 MED ORDER — AMLODIPINE BESYLATE 5 MG PO TABS: 5.0000 mg | ORAL_TABLET | Freq: Every day | ORAL | 1 refills | Status: DC

## 2016-07-18 NOTE — Progress Notes (Signed)
Patient ID: Shelby Spencer, female   DOB: Apr 12, 1946, 71 y.o.   MRN: [id number]   Subjective:    Patient ID: Shelby Spencer, female    DOB: 12/13/1945, 71 y.o.   MRN: [id number]  HPI  Patient here for a scheduled follow up.  She is doing well.  Feels good.  Stays active.  No chest pain.  No sob.  No acid reflux.  No abdominal pain or cramping.  Bowels stable.  Saw dermatology.  Had body check.  Ok.     Past Medical History:  Diagnosis Date  . History of colon polyps   . History of prolapse of bladder   . History of pyloric stenosis   . Hypercholesterolemia    Past Surgical History:  Procedure Laterality Date  . CATARACT EXTRACTION, BILATERAL    . TUBAL LIGATION     Family History  Problem Relation Age of Onset  . Hypertension Mother   . Heart disease Father    Social History   Social History  . Marital status: Married    Spouse name: N/A  . Number of children: N/A  . Years of education: N/A   Social History Main Topics  . Smoking status: Never Smoker  . Smokeless tobacco: Never Used  . Alcohol use 1.8 oz/week    3 Glasses of wine per week  . Drug use: No  . Sexual activity: Not Asked   Other Topics Concern  . None   Social History Narrative  . None    Outpatient Encounter Prescriptions as of 07/18/2016  Medication Sig  . amLODipine (NORVASC) 5 MG tablet Take 1 tablet (5 mg total) by mouth daily.  . Calcium Carb-Cholecalciferol (CALCIUM 1000 + D) 1000-800 MG-UNIT TABS Take 1 tablet by mouth daily.  . Calcium Carbonate-Vitamin D (CALCIUM + D PO) Take by mouth.  . doxycycline (VIBRAMYCIN) 50 MG capsule Take 50 mg by mouth daily.  Marland Kitchen losartan (COZAAR) 100 MG tablet Take 1 tablet (100 mg total) by mouth daily.  . rosuvastatin (CRESTOR) 5 MG tablet Take one tablet q day  . [DISCONTINUED] amLODipine (NORVASC) 5 MG tablet TAKE 1 TABLET BY MOUTH ONCE DAILY  . [DISCONTINUED] losartan (COZAAR) 100 MG tablet TAKE 1 TABLET BY MOUTH ONCE DAILY  . [DISCONTINUED] rosuvastatin  (CRESTOR) 5 MG tablet Take one tablet q day (Patient taking differently: Take 5 mg by mouth. Three times a week (every Monday, Wednesday, & Friday))  . aspirin 81 MG tablet Take 81 mg by mouth daily. Reported on 12/13/2015   Facility-Administered Encounter Medications as of 07/18/2016  Medication  . 0.9 %  sodium chloride infusion    Review of Systems  Constitutional: Negative for appetite change and unexpected weight change.  HENT: Negative for congestion and sinus pressure.   Respiratory: Negative for cough, chest tightness and shortness of breath.   Cardiovascular: Negative for chest pain, palpitations and leg swelling.  Gastrointestinal: Negative for abdominal pain, diarrhea, nausea and vomiting.  Genitourinary: Negative for difficulty urinating and dysuria.  Musculoskeletal: Negative for back pain and joint swelling.  Skin: Negative for color change and rash.  Neurological: Negative for dizziness, light-headedness and headaches.  Psychiatric/Behavioral: Negative for agitation and dysphoric mood.       Objective:    Physical Exam  Constitutional: She appears well-developed and well-nourished. No distress.  HENT:  Nose: Nose normal.  Mouth/Throat: Oropharynx is clear and moist.  Neck: Neck supple. No thyromegaly present.  Cardiovascular: Normal rate and regular rhythm.   Pulmonary/Chest:  Breath sounds normal. No respiratory distress. She has no wheezes.  Abdominal: Soft. Bowel sounds are normal. There is no tenderness.  Musculoskeletal: She exhibits no edema or tenderness.  Lymphadenopathy:    She has no cervical adenopathy.  Skin: No rash noted. No erythema.  Psychiatric: She has a normal mood and affect. Her behavior is normal.    BP 128/78 (BP Location: Left Arm, Patient Position: Sitting, Cuff Size: Large)   Pulse 78   Temp 98.6 F (37 C) (Oral)   Resp 18   Ht 5\' 7"  (1.702 m)   Wt 167 lb 12.8 oz (76.1 kg)   SpO2 99%   BMI 26.28 kg/m  Wt Readings from Last 3  Encounters:  07/18/16 167 lb 12.8 oz (76.1 kg)  03/12/16 163 lb 9.6 oz (74.2 kg)  01/02/16 163 lb 2 oz (74 kg)     Lab Results  Component Value Date   WBC 5.6 10/12/2015   HGB 13.6 10/12/2015   HCT 39.7 10/12/2015   PLT 359.0 10/12/2015   GLUCOSE 93 07/16/2016   CHOL 204 (H) 07/16/2016   TRIG 101.0 07/16/2016   HDL 87.50 07/16/2016   LDLCALC 96 07/16/2016   ALT 12 07/16/2016   AST 13 07/16/2016   NA 138 07/16/2016   K 4.6 07/16/2016   CL 105 07/16/2016   CREATININE 1.08 07/16/2016   BUN 23 07/16/2016   CO2 28 07/16/2016   TSH 4.00 10/12/2015       Assessment & Plan:   Problem List Items Addressed This Visit    Hypercholesterolemia    Low cholesterol diet and exercise.  Follow lipid panel.   Lab Results  Component Value Date   CHOL 204 (H) 07/16/2016   HDL 87.50 07/16/2016   LDLCALC 96 07/16/2016   TRIG 101.0 07/16/2016   CHOLHDL 2 07/16/2016        Relevant Medications   losartan (COZAAR) 100 MG tablet   rosuvastatin (CRESTOR) 5 MG tablet   amLODipine (NORVASC) 5 MG tablet   Other Relevant Orders   Hepatic function panel   Lipid panel   Hypertension, essential - Primary    Blood pressure under good control.  Continue same medication regimen.  Follow pressures.  Follow metabolic panel.        Relevant Medications   losartan (COZAAR) 100 MG tablet   rosuvastatin (CRESTOR) 5 MG tablet   amLODipine (NORVASC) 5 MG tablet   Other Relevant Orders   CBC with Differential/Platelet   TSH   Basic metabolic panel   Renal artery stenosis ([facility])    Saw vascular surgery (Dr Bridgett Larsson).  Blood pressure doing well.  Renal function stable.        Relevant Medications   losartan (COZAAR) 100 MG tablet   rosuvastatin (CRESTOR) 5 MG tablet   amLODipine (NORVASC) 5 MG tablet    Other Visit Diagnoses    Need for pneumococcal vaccination       Relevant Orders   Pneumococcal polysaccharide vaccine 23-valent greater than or equal to 2yo subcutaneous/IM (Completed)        Einar Pheasant, MD

## 2016-07-18 NOTE — Progress Notes (Signed)
Pre-visit discussion using our clinic review tool. No additional management support is needed unless otherwise documented below in the visit note.  

## 2016-07-28 ENCOUNTER — Encounter: Payer: Self-pay | Admitting: Internal Medicine

## 2016-07-28 NOTE — Assessment & Plan Note (Signed)
Blood pressure under good control.  Continue same medication regimen.  Follow pressures.  Follow metabolic panel.   

## 2016-07-28 NOTE — Assessment & Plan Note (Signed)
Saw vascular surgery (Dr Chen).  Blood pressure doing well.  Renal function stable.   

## 2016-07-28 NOTE — Assessment & Plan Note (Signed)
Low cholesterol diet and exercise.  Follow lipid panel.   Lab Results  Component Value Date   CHOL 204 (H) 07/16/2016   HDL 87.50 07/16/2016   LDLCALC 96 07/16/2016   TRIG 101.0 07/16/2016   CHOLHDL 2 07/16/2016

## 2016-11-12 ENCOUNTER — Other Ambulatory Visit (INDEPENDENT_AMBULATORY_CARE_PROVIDER_SITE_OTHER): Payer: Medicare Other

## 2016-11-12 DIAGNOSIS — E7800 Pure hypercholesterolemia, unspecified: Secondary | ICD-10-CM | POA: Diagnosis not present

## 2016-11-12 DIAGNOSIS — I10 Essential (primary) hypertension: Secondary | ICD-10-CM | POA: Diagnosis not present

## 2016-11-12 LAB — CBC WITH DIFFERENTIAL/PLATELET
BASOS ABS: 0.1 10*3/uL (ref 0.0–0.1)
BASOS PCT: 1 % (ref 0.0–3.0)
EOS ABS: 0.2 10*3/uL (ref 0.0–0.7)
Eosinophils Relative: 4.1 % (ref 0.0–5.0)
HCT: 40.8 % (ref 36.0–46.0)
Hemoglobin: 13.7 g/dL (ref 12.0–15.0)
Lymphocytes Relative: 35.7 % (ref 12.0–46.0)
Lymphs Abs: 2 10*3/uL (ref 0.7–4.0)
MCHC: 33.5 g/dL (ref 30.0–36.0)
MCV: 90.7 fl (ref 78.0–100.0)
Monocytes Absolute: 0.5 10*3/uL (ref 0.1–1.0)
Monocytes Relative: 9.5 % (ref 3.0–12.0)
NEUTROS ABS: 2.8 10*3/uL (ref 1.4–7.7)
Neutrophils Relative %: 49.7 % (ref 43.0–77.0)
PLATELETS: 367 10*3/uL (ref 150.0–400.0)
RBC: 4.5 Mil/uL (ref 3.87–5.11)
RDW: 13.7 % (ref 11.5–15.5)
WBC: 5.6 10*3/uL (ref 4.0–10.5)

## 2016-11-12 LAB — BASIC METABOLIC PANEL
BUN: 27 mg/dL — ABNORMAL HIGH (ref 6–23)
CO2: 29 mEq/L (ref 19–32)
CREATININE: 1.1 mg/dL (ref 0.40–1.20)
Calcium: 10 mg/dL (ref 8.4–10.5)
Chloride: 105 mEq/L (ref 96–112)
GFR: 52.2 mL/min — AB (ref >60.00)
GLUCOSE: 99 mg/dL (ref 70–99)
POTASSIUM: 5.3 meq/L — AB (ref 3.5–5.1)
Sodium: 139 mEq/L (ref 135–145)

## 2016-11-12 LAB — LIPID PANEL
CHOL/HDL RATIO: 2
Cholesterol: 197 mg/dL (ref 0–200)
HDL: 81.8 mg/dL (ref >39.00)
LDL CALC: 94 mg/dL (ref 0–99)
NonHDL: 114.7
TRIGLYCERIDES: 102 mg/dL (ref 0.0–149.0)
VLDL: 20.4 mg/dL (ref 0.0–40.0)

## 2016-11-12 LAB — HEPATIC FUNCTION PANEL
ALT: 11 U/L (ref 0–35)
AST: 12 U/L (ref 0–37)
Albumin: 4.6 g/dL (ref 3.5–5.2)
Alkaline Phosphatase: 68 U/L (ref 39–117)
BILIRUBIN DIRECT: 0.2 mg/dL (ref 0.0–0.3)
BILIRUBIN TOTAL: 0.6 mg/dL (ref 0.2–1.2)
TOTAL PROTEIN: 7.1 g/dL (ref 6.0–8.3)

## 2016-11-12 LAB — TSH: TSH: 3.9 u[IU]/mL (ref 0.35–4.50)

## 2016-11-13 ENCOUNTER — Other Ambulatory Visit: Payer: Self-pay | Admitting: Internal Medicine

## 2016-11-13 DIAGNOSIS — E875 Hyperkalemia: Secondary | ICD-10-CM

## 2016-11-13 NOTE — Progress Notes (Signed)
Order placed for f/u stat potassium.  

## 2016-11-14 ENCOUNTER — Other Ambulatory Visit (INDEPENDENT_AMBULATORY_CARE_PROVIDER_SITE_OTHER): Payer: Medicare Other

## 2016-11-14 ENCOUNTER — Telehealth: Payer: Self-pay | Admitting: Internal Medicine

## 2016-11-14 DIAGNOSIS — E875 Hyperkalemia: Secondary | ICD-10-CM | POA: Diagnosis not present

## 2016-11-14 LAB — POTASSIUM: Potassium: 4.3 mEq/L (ref 3.5–5.1)

## 2016-11-14 NOTE — Telephone Encounter (Signed)
Pt called wanted to know if her potassium was elevated because of her taking losartan potassium. Please advise, thank you!    Call pt @ [phone]

## 2016-11-14 NOTE — Telephone Encounter (Signed)
Left message for patient to return call to office. 

## 2016-11-14 NOTE — Telephone Encounter (Signed)
Patient notified and voiced understanding.

## 2016-11-14 NOTE — Telephone Encounter (Signed)
Pt called back returning your call. Please advise, thank you!  Call pt @ [phone]

## 2016-11-14 NOTE — Telephone Encounter (Signed)
Could losartan be elevating patient potassium?

## 2016-11-14 NOTE — Telephone Encounter (Signed)
Losartan can (in some pts) elevate potassium.  She has been on losartan and has not had an issue previously.  The elevation may be related to some hemolysis.  Before changing anything, I want to recheck lab first.

## 2016-11-15 ENCOUNTER — Encounter: Payer: Self-pay | Admitting: Internal Medicine

## 2016-11-15 ENCOUNTER — Ambulatory Visit (INDEPENDENT_AMBULATORY_CARE_PROVIDER_SITE_OTHER): Payer: Medicare Other | Admitting: Internal Medicine

## 2016-11-15 VITALS — BP 124/70 | HR 68 | Temp 98.3°F | Resp 14 | Ht 67.5 in | Wt 166.0 lb

## 2016-11-15 DIAGNOSIS — Z1159 Encounter for screening for other viral diseases: Secondary | ICD-10-CM

## 2016-11-15 DIAGNOSIS — I701 Atherosclerosis of renal artery: Secondary | ICD-10-CM | POA: Diagnosis not present

## 2016-11-15 DIAGNOSIS — E7800 Pure hypercholesterolemia, unspecified: Secondary | ICD-10-CM

## 2016-11-15 DIAGNOSIS — I10 Essential (primary) hypertension: Secondary | ICD-10-CM | POA: Diagnosis not present

## 2016-11-15 DIAGNOSIS — Z1211 Encounter for screening for malignant neoplasm of colon: Secondary | ICD-10-CM

## 2016-11-15 DIAGNOSIS — Z0000 Encounter for general adult medical examination without abnormal findings: Secondary | ICD-10-CM | POA: Diagnosis not present

## 2016-11-15 NOTE — Progress Notes (Signed)
Pre visit review using our clinic review tool, if applicable. No additional management support is needed unless otherwise documented below in the visit note.    Subjective:    Patient ID: Shelby Spencer, female    DOB: Aug 27, 1945, 71 y.o.   MRN: [id number]  HPI  Patient here for a scheduled follow up.   She reports she is doing well.  Feels good.  Stays active.  No chest pain.  No sob.  No acid reflux.  No abdominal pain.  Bowels moving.  Discussed her labs.  Repeat potassium wnl.  She just returned for a trip to [place].  She is taking her medication.  Overall feels good.  Recheck potassium - normal.  Discussed labs today.     Past Medical History:  Diagnosis Date  . History of colon polyps   . History of prolapse of bladder   . History of pyloric stenosis   . Hypercholesterolemia    Past Surgical History:  Procedure Laterality Date  . CATARACT EXTRACTION, BILATERAL    . TUBAL LIGATION     Family History  Problem Relation Age of Onset  . Hypertension Mother   . Heart disease Father    Social History   Social History  . Marital status: Married    Spouse name: N/A  . Number of children: N/A  . Years of education: N/A   Social History Main Topics  . Smoking status: Never Smoker  . Smokeless tobacco: Never Used  . Alcohol use 3.0 oz/week    5 Glasses of wine per week  . Drug use: No  . Sexual activity: Not Currently   Other Topics Concern  . None   Social History Narrative  . None    Outpatient Encounter Prescriptions as of 11/15/2016  Medication Sig  . amLODipine (NORVASC) 5 MG tablet Take 1 tablet (5 mg total) by mouth daily.  . Calcium Carbonate-Vitamin D (CALCIUM + D PO) Take by mouth.  . doxycycline (VIBRAMYCIN) 50 MG capsule Take 50 mg by mouth daily.  Marland Kitchen losartan (COZAAR) 100 MG tablet Take 1 tablet (100 mg total) by mouth daily.  . rosuvastatin (CRESTOR) 5 MG tablet Take one tablet q day  . [DISCONTINUED] aspirin 81 MG tablet Take 81 mg by mouth daily.  Reported on 12/13/2015  . [DISCONTINUED] Calcium Carb-Cholecalciferol (CALCIUM 1000 + D) 1000-800 MG-UNIT TABS Take 1 tablet by mouth daily.   Facility-Administered Encounter Medications as of 11/15/2016  Medication  . 0.9 %  sodium chloride infusion    Review of Systems  Constitutional: Negative for appetite change and unexpected weight change.  HENT: Negative for congestion and sinus pressure.   Respiratory: Negative for cough, chest tightness and shortness of breath.   Cardiovascular: Negative for chest pain, palpitations and leg swelling.  Gastrointestinal: Negative for abdominal pain, diarrhea, nausea and vomiting.  Genitourinary: Negative for difficulty urinating and dysuria.  Musculoskeletal: Negative for back pain and joint swelling.  Skin: Negative for color change and rash.  Neurological: Negative for dizziness, light-headedness and headaches.  Psychiatric/Behavioral: Negative for agitation and dysphoric mood.       Objective:    Physical Exam  Constitutional: She appears well-developed and well-nourished. No distress.  HENT:  Nose: Nose normal.  Mouth/Throat: Oropharynx is clear and moist.  Neck: Neck supple. No thyromegaly present.  Cardiovascular: Normal rate and regular rhythm.   Pulmonary/Chest: Breath sounds normal. No respiratory distress. She has no wheezes.  Abdominal: Soft. Bowel sounds are normal. There is no tenderness.  Musculoskeletal: She exhibits no edema or tenderness.  Lymphadenopathy:    She has no cervical adenopathy.  Skin: No rash noted. No erythema.  Psychiatric: She has a normal mood and affect. Her behavior is normal.    BP 124/70 (BP Location: Left Arm, Patient Position: Sitting, Cuff Size: Normal)   Pulse 68   Temp 98.3 F (36.8 C) (Oral)   Resp 14   Ht 5' 7.5" (1.715 m)   Wt 166 lb (75.3 kg)   SpO2 99%   BMI 25.62 kg/m  Wt Readings from Last 3 Encounters:  11/15/16 166 lb (75.3 kg)  07/18/16 167 lb 12.8 oz (76.1 kg)  03/12/16  163 lb 9.6 oz (74.2 kg)     Lab Results  Component Value Date   WBC 5.6 11/12/2016   HGB 13.7 11/12/2016   HCT 40.8 11/12/2016   PLT 367.0 11/12/2016   GLUCOSE 99 11/12/2016   CHOL 197 11/12/2016   TRIG 102.0 11/12/2016   HDL 81.80 11/12/2016   LDLCALC 94 11/12/2016   ALT 11 11/12/2016   AST 12 11/12/2016   NA 139 11/12/2016   K 4.3 11/14/2016   CL 105 11/12/2016   CREATININE 1.10 11/12/2016   BUN 27 (H) 11/12/2016   CO2 29 11/12/2016   TSH 3.90 11/12/2016    No results found.     Assessment & Plan:   Problem List Items Addressed This Visit    Hypercholesterolemia    Low cholesterol diet and exercise.  On crestor.  Cholesterol improved.  Follow lipid panel and liver function tests.   Lab Results  Component Value Date   CHOL 197 11/12/2016   HDL 81.80 11/12/2016   LDLCALC 94 11/12/2016   TRIG 102.0 11/12/2016   CHOLHDL 2 11/12/2016        Relevant Orders   Hepatic function panel   Lipid panel   Hypertension, essential    Blood pressure under good control.  Continue same medication regimen.  Follow pressures.  Follow metabolic panel.        Relevant Orders   Basic metabolic panel   Renal artery stenosis ([place])    Saw vascular surgery (Dr Bridgett Larsson).  Blood pressure doing well.  Renal function stable.         Other Visit Diagnoses    Encounter for Medicare annual wellness exam    -  Primary   Screening for colon cancer       Relevant Orders   Ambulatory referral to Gastroenterology (for Colonoscopy)   Need for hepatitis C screening test       Relevant Orders   Hepatitis C antibody screen       Einar Pheasant, MD

## 2016-11-15 NOTE — Patient Instructions (Addendum)
  Shelby Spencer , Thank you for taking time to come for your Medicare Wellness Visit. I appreciate your ongoing commitment to your health goals. Please review the following plan we discussed and let me know if I can assist you in the future.   Follow up with Dr. Nicki Reaper as needed.    Bring a copy of your [place] and/or Living Will to be scanned into chart once completed.  Colonoscopy ordered, follow as directed.  Consider Hepatitis C Screening.  Educational material provided.  Have a great day!  These are the goals we discussed: Goals    . Increase physical activity          Work towards going to the gym 3 days week, 30 minutes       This is a list of the screening recommended for you and due dates:  Health Maintenance  Topic Date Due  .  Hepatitis C: One time screening is recommended by Center for Disease Control  (CDC) for  adults born from [birth year] through 1964.   29-May-1946  . Colon Cancer Screening  12/28/2015  . Flu Shot  01/01/2017  . Mammogram  04/29/2018  . Tetanus Vaccine  02/14/2025  . DEXA scan (bone density measurement)  Completed  . Pneumonia vaccines  Completed

## 2016-11-15 NOTE — Progress Notes (Signed)
Subjective:   Shelby Spencer is a 71 y.o. female who presents for an Initial Medicare Annual Wellness Visit.  Review of Systems    No ROS.  Medicare Wellness Visit. Additional risk factors are reflected in the social history.  Cardiac Risk Factors include: advanced age (>53men, >67 women);hypertension     Objective:    Today's Vitals   11/15/16 0951  BP: 124/70  Pulse: 68  Resp: 14  Temp: 98.3 F (36.8 C)  TempSrc: Oral  SpO2: 99%  Weight: 166 lb (75.3 kg)  Height: 5' 7.5" (1.715 m)   Body mass index is 25.62 kg/m.   Current Medications (verified) Outpatient Encounter Prescriptions as of 11/15/2016  Medication Sig  . amLODipine (NORVASC) 5 MG tablet Take 1 tablet (5 mg total) by mouth daily.  . Calcium Carbonate-Vitamin D (CALCIUM + D PO) Take by mouth.  . doxycycline (VIBRAMYCIN) 50 MG capsule Take 50 mg by mouth daily.  Marland Kitchen losartan (COZAAR) 100 MG tablet Take 1 tablet (100 mg total) by mouth daily.  . rosuvastatin (CRESTOR) 5 MG tablet Take one tablet q day  . [DISCONTINUED] aspirin 81 MG tablet Take 81 mg by mouth daily. Reported on 12/13/2015  . [DISCONTINUED] Calcium Carb-Cholecalciferol (CALCIUM 1000 + D) 1000-800 MG-UNIT TABS Take 1 tablet by mouth daily.   Facility-Administered Encounter Medications as of 11/15/2016  Medication  . 0.9 %  sodium chloride infusion    Allergies (verified) Patient has no known allergies.   History: Past Medical History:  Diagnosis Date  . History of colon polyps   . History of prolapse of bladder   . History of pyloric stenosis   . Hypercholesterolemia    Past Surgical History:  Procedure Laterality Date  . CATARACT EXTRACTION, BILATERAL    . TUBAL LIGATION     Family History  Problem Relation Age of Onset  . Hypertension Mother   . Heart disease Father    Social History   Occupational History  . Not on file.   Social History Main Topics  . Smoking status: Never Smoker  . Smokeless tobacco: Never Used  .  Alcohol use 3.0 oz/week    5 Glasses of wine per week  . Drug use: No  . Sexual activity: Not Currently    Tobacco Counseling Counseling given: Not Answered   Activities of Daily Living In your present state of health, do you have any difficulty performing the following activities: 11/15/2016  Hearing? N  Vision? N  Difficulty concentrating or making decisions? N  Walking or climbing stairs? N  Dressing or bathing? N  Doing errands, shopping? N  Preparing Food and eating ? N  Using the Toilet? N  In the past six months, have you accidently leaked urine? N  Do you have problems with loss of bowel control? N  Managing your Medications? N  Managing your Finances? N  Housekeeping or managing your Housekeeping? N  Some recent data might be hidden    Immunizations and Health Maintenance Immunization History  Administered Date(s) Administered  . Influenza-Unspecified 02/15/2015  . Pneumococcal Conjugate-13 10/18/2014  . Pneumococcal Polysaccharide-23 07/18/2016  . Tdap 02/15/2015  . Zoster 05/03/2014   Health Maintenance Due  Topic Date Due  . Hepatitis C Screening  1946-05-13  . COLONOSCOPY  12/28/2015    Patient Care Team: Einar Pheasant, MD as PCP - General (Internal Medicine)  Indicate any recent Medical Services you may have received from other than [facility] providers in the past year (date may  be approximate).     Assessment:   This is a routine wellness examination for [name]. The goal of the wellness visit is to assist the patient how to close the gaps in care and create a preventative care plan for the patient.   The roster of all physicians providing medical care to patient is listed in the Snapshot section of the chart.  Taking calcium VIT D as appropriate/Osteoporosis risk reviewed.    Safety issues reviewed; Smoke and carbon monoxide detectors in the home. No firearms in the home. Wears seatbelts when driving or riding with others. Patient does wear  sunscreen or protective clothing when in direct sunlight. No violence in the home.  Depression- PHQ 2 &9 complete.  No signs/symptoms or verbal communication regarding little pleasure in doing things, feeling down, depressed or hopeless. No changes in sleeping, energy, eating, concentrating.  No thoughts of self harm or harm towards others.    Patient is alert, normal appearance, oriented to person/place/and time.  Correctly identified the president of the [place], recall of 3/3 words, and performing simple calculations. Displays appropriate judgement and can read correct time from watch face.   No new identified risk were noted.  No failures at ADL's or IADL's.    BMI- discussed the importance of a healthy diet, water intake and the benefits of aerobic exercise. Educational material provided.   24 hour diet recall: Breakfast: low fat yogurt Lunch: fish, lima beans, salad  Dinner: lean meat vegetable kabob  Daily fluid intake: 1 cups of caffeine, 4 cups of water, 1 juice   Dental- every 6 months.  Dr. Carman Ching.  Sleep patterns- Sleeps 6-7 hours at night.  Wakes feeling rested.  Colonoscopy ordered; follow as directed.  Educational material provided.  Hepatitis C Screening discussed.  Future lab ordered.  Educational material provided.  Patient Concerns: None at this time. Follow up with PCP as needed.  Hearing/Vision screen Hearing Screening Comments: Patient is able to hear conversational tones without difficulty.  No issues reported.   Vision Screening Comments: Followed by Dr. Sondra Barges Wears corrective lenses when reading Cataract extraction, bilateral Visual acuity not assessed per patient preference since they have regular follow up with the ophthalmologist  Dietary issues and exercise activities discussed: Current Exercise Habits: Home exercise routine, Type of exercise: walking, Time (Minutes): 20, Frequency (Times/Week): 4, Weekly Exercise (Minutes/Week): 80,  Intensity: Moderate  Goals    . Increase physical activity          Work towards going to the gym 3 days week, 30 minutes      Depression Screen PHQ 2/9 Scores 11/15/2016 03/12/2016 10/18/2014  PHQ - 2 Score 0 0 0    Fall Risk Fall Risk  11/15/2016 03/12/2016 10/18/2014  Falls in the past year? No No No    Cognitive Function: MMSE - Mini Mental State Exam 11/15/2016  Orientation to time 5  Orientation to Place 5  Registration 3  Attention/ Calculation 5  Recall 3  Language- name 2 objects 2  Language- repeat 1  Language- follow 3 step command 3  Language- read & follow direction 1  Write a sentence 1  Copy design 1  Total score 30        Screening Tests Health Maintenance  Topic Date Due  . Hepatitis C Screening  [birth year]  . COLONOSCOPY  12/28/2015  . INFLUENZA VACCINE  01/01/2017  . MAMMOGRAM  04/29/2018  . TETANUS/TDAP  02/14/2025  . DEXA SCAN  Completed  . PNA  vac Low Risk Adult  Completed      Plan:   End of life planning; Advanced aging; Advanced directives discussed.  No HCPOA/Living Will.  Additional information declined at this time.  I have personally reviewed and noted the following in the patient's chart:   . Medical and social history . Use of alcohol, tobacco or illicit drugs  . Current medications and supplements . Functional ability and status . Nutritional status . Physical activity . Advanced directives . List of other physicians . Hospitalizations, surgeries, and ER visits in previous 12 months . Vitals . Screenings to include cognitive, depression, and falls . Referrals and appointments  In addition, I have reviewed and discussed with patient certain preventive protocols, quality metrics, and best practice recommendations. A written personalized care plan for preventive services as well as general preventive health recommendations were provided to patient.     Varney Biles, LPN   02/15/7828    Reviewed above  information.  Agree with plan.   Dr Nicki Reaper

## 2016-11-16 ENCOUNTER — Encounter: Payer: Self-pay | Admitting: Internal Medicine

## 2016-11-16 NOTE — Assessment & Plan Note (Signed)
Low cholesterol diet and exercise.  On crestor.  Cholesterol improved.  Follow lipid panel and liver function tests.   Lab Results  Component Value Date   CHOL 197 11/12/2016   HDL 81.80 11/12/2016   LDLCALC 94 11/12/2016   TRIG 102.0 11/12/2016   CHOLHDL 2 11/12/2016

## 2016-11-16 NOTE — Assessment & Plan Note (Signed)
Saw vascular surgery (Dr Bridgett Larsson).  Blood pressure doing well.  Renal function stable.

## 2016-11-16 NOTE — Assessment & Plan Note (Signed)
Blood pressure under good control.  Continue same medication regimen.  Follow pressures.  Follow metabolic panel.   

## 2016-12-03 ENCOUNTER — Encounter: Payer: Self-pay | Admitting: Gastroenterology

## 2016-12-11 ENCOUNTER — Encounter: Payer: Self-pay | Admitting: Vascular Surgery

## 2016-12-12 ENCOUNTER — Other Ambulatory Visit: Payer: Self-pay

## 2016-12-12 DIAGNOSIS — I701 Atherosclerosis of renal artery: Secondary | ICD-10-CM

## 2016-12-12 NOTE — Progress Notes (Signed)
Established Renal Artery Stenosis    History of Present Illness   Shelby Spencer is a 71 y.o. (09-11-1945) female who presents with chief complaint: follow up on renal artery stenosis.  Previous renal duplex completed revealed: R RA stenosis: >60% and L RA stenosis: minimal stenosis.  The patient's blood pressure has been stable.  The patient's blood pressure medication regimen has remained stable.  The patient's urinary history has remained stable.  The patient's PMH, PSH, and SH, and FamHx are unchanged from 12/13/15.   Current Outpatient Prescriptions  Medication Sig Dispense Refill  . amLODipine (NORVASC) 5 MG tablet Take 1 tablet (5 mg total) by mouth daily. 90 tablet 1  . Calcium Carbonate-Vitamin D (CALCIUM + D PO) Take by mouth.    . doxycycline (VIBRAMYCIN) 50 MG capsule Take 50 mg by mouth daily.  6  . losartan (COZAAR) 100 MG tablet Take 1 tablet (100 mg total) by mouth daily. 90 tablet 1  . rosuvastatin (CRESTOR) 5 MG tablet Take one tablet q day (Patient taking differently: 2 (two) times a week. Take one tablet q day) 90 tablet 1   Current Facility-Administered Medications  Medication Dose Route Frequency Provider Last Rate Last Dose  . 0.9 %  sodium chloride infusion  500 mL Intravenous Continuous Irene Shipper, MD        No Known Allergies  On ROS today: no change in BP, known whitecoat HTN   Physical Examination   Vitals:   12/20/16 0904 12/20/16 0907  BP: (!) 157/85 (!) 151/81  Pulse: 70   Resp: 18   Temp: (!) 97.2 F (36.2 C)   TempSrc: Oral   SpO2: 100%   Weight: 165 lb 8 oz (75.1 kg)   Height: 5' 7.5" (1.715 m)    Body mass index is 25.54 kg/m.  General Alert, O x 3, WD, NAD  Pulmonary Sym exp, good B air movt, CTA B  Cardiac RRR, Nl S1, S2, no Murmurs, No rubs, No S3,S4  Vascular Vessel Right Left  Radial Palpable Palpable  Brachial Palpable Palpable  Carotid Palpable, No Bruit Palpable, No Bruit  Aorta Not palpable N/A  Femoral Palpable  Palpable  Popliteal Not palpable Not palpable  PT Palpable Palpable  DP Palpable Palpable    Gastro- intestinal soft, non-distended, non-tender to palpation, No guarding or rebound, no HSM, no masses, no CVAT B, No palpable prominent aortic pulse,  no flank bruit  Musculo- skeletal M/S 5/5 throughout  , Extremities without ischemic changes  , No edema present, No visible varicosities , No Lipodermatosclerosis present  Neurologic Pain and light touch intact in extremities , Motor exam as listed above    Non-Invasive Vascular Imaging   B Renal Duplex (12/20/2016)  R kidney size: 10.1 cm  L kidney size: 9.8 cm  R RA stenosis: >60% (RAR 2.1)  L RA stenosis: <60% (RAR 0.8)   Medical Decision Making   Shelby Spencer is a 70 y.o. female who presents with: questionable R RAS, no evidence of renovascular HTN   Though the pt's PSV on the R RA meets criteria, the RA is only 2.1 rather than >3.5, so I would interpret this as an equivocal study.  The patient has no signs or sx of RAS, so I doubt any benefit to renal artery angiogram.  Based on the patient's vascular studies and examination, I have offered the patient: annual B renal duplex.  If the study next time is again underwhelming, will consider  discontinuing the renal duplex.  I discussed in depth with the patient the nature of atherosclerosis, and emphasized the importance of maximal medical management including strict control of blood pressure, blood glucose, and lipid levels, antiplatelet agents, obtaining regular exercise, and cessation of smoking.    The patient is aware that without maximal medical management the underlying atherosclerotic disease process will progress, limiting the benefit of any interventions. The patient is currently on a statin: Crestor.  The patient is currently on an anti-platelet: ASA.  Thank you for allowing [place] to participate in this patient's care.   Adele Barthel, MD, FACS [facility] Office: [phone] Pager: [phone]

## 2016-12-20 ENCOUNTER — Encounter: Payer: Self-pay | Admitting: Vascular Surgery

## 2016-12-20 ENCOUNTER — Ambulatory Visit (HOSPITAL_COMMUNITY)
Admission: RE | Admit: 2016-12-20 | Discharge: 2016-12-20 | Disposition: A | Discharge status: Home / Self Care | Payer: Medicare Other | Source: Ambulatory Visit | Attending: Vascular Surgery | Admitting: Vascular Surgery

## 2016-12-20 ENCOUNTER — Ambulatory Visit (INDEPENDENT_AMBULATORY_CARE_PROVIDER_SITE_OTHER): Payer: Medicare Other | Admitting: Vascular Surgery

## 2016-12-20 VITALS — BP 151/81 | HR 70 | Temp 97.2°F | Resp 18 | Ht 67.5 in | Wt 165.5 lb

## 2016-12-20 DIAGNOSIS — I701 Atherosclerosis of renal artery: Secondary | ICD-10-CM | POA: Diagnosis not present

## 2017-01-03 NOTE — Addendum Note (Signed)
Addended by: Lianne Cure A on: 01/03/2017 09:19 AM   Modules accepted: Orders

## 2017-02-11 ENCOUNTER — Ambulatory Visit (AMBULATORY_SURGERY_CENTER): Payer: Self-pay

## 2017-02-11 VITALS — Ht 67.0 in | Wt 167.2 lb

## 2017-02-11 DIAGNOSIS — Z86010 Personal history of colonic polyps: Secondary | ICD-10-CM

## 2017-02-11 MED ORDER — NA SULFATE-K SULFATE-MG SULF 17.5-3.13-1.6 GM/177ML PO SOLN: 1.0000 | Freq: Once | ORAL | 0 refills | Status: AC

## 2017-02-11 NOTE — Progress Notes (Signed)
Denies allergies to eggs or soy products. Denies complication of anesthesia or sedation. Denies use of weight loss medication. Denies use of O2.   Emmi instructions declined.  

## 2017-02-25 ENCOUNTER — Ambulatory Visit (AMBULATORY_SURGERY_CENTER): Payer: Medicare Other | Admitting: Internal Medicine

## 2017-02-25 ENCOUNTER — Encounter: Payer: Self-pay | Admitting: Internal Medicine

## 2017-02-25 VITALS — BP 116/57 | HR 64 | Temp 97.1°F | Resp 15 | Ht 67.0 in | Wt 167.0 lb

## 2017-02-25 DIAGNOSIS — E669 Obesity, unspecified: Secondary | ICD-10-CM | POA: Diagnosis not present

## 2017-02-25 DIAGNOSIS — D120 Benign neoplasm of cecum: Secondary | ICD-10-CM

## 2017-02-25 DIAGNOSIS — Z86010 Personal history of colonic polyps: Secondary | ICD-10-CM | POA: Diagnosis not present

## 2017-02-25 DIAGNOSIS — I10 Essential (primary) hypertension: Secondary | ICD-10-CM | POA: Diagnosis not present

## 2017-02-25 MED ORDER — SODIUM CHLORIDE 0.9 % IV SOLN: 500.0000 mL | INTRAVENOUS | Status: DC

## 2017-02-25 NOTE — Op Note (Signed)
[facility] Patient Name: Shelby Spencer Procedure Date: 02/25/2017 9:44 AM MRN: [id number] Endoscopist: Docia Chuck. Henrene Pastor , MD Age: 71 Referring MD:  Date of Birth: [birth year] Gender: Female Account #: [id number] Procedure:                Colonoscopy with cold snare polypectomy x 1 Indications:              High risk colon cancer surveillance: Personal                            history of non-advanced adenoma. Index exam 12-2010 Medicines:                Monitored Anesthesia Care Procedure:                Pre-Anesthesia Assessment:                           - Prior to the procedure, a History and Physical                            was performed, and patient medications and                            allergies were reviewed. The patient's tolerance of                            previous anesthesia was also reviewed. The risks                            and benefits of the procedure and the sedation                            options and risks were discussed with the patient.                            All questions were answered, and informed consent                            was obtained. Prior Anticoagulants: The patient has                            taken no previous anticoagulant or antiplatelet                            agents. ASA Grade Assessment: II - A patient with                            mild systemic disease. After reviewing the risks                            and benefits, the patient was deemed in                            satisfactory condition to undergo the procedure.  After obtaining informed consent, the colonoscope                            was passed under direct vision. Throughout the                            procedure, the patient's blood pressure, pulse, and                            oxygen saturations were monitored continuously. The                            Colonoscope was introduced through the anus and                advanced to the the cecum, identified by                            appendiceal orifice and ileocecal valve. The                            ileocecal valve, appendiceal orifice, and rectum                            were photographed. The quality of the bowel                            preparation was excellent. The colonoscopy was                            performed without difficulty. The patient tolerated                            the procedure well. The bowel preparation used was                            SUPREP. Scope In: 9:57:31 AM Scope Out: 10:14:19 AM Scope Withdrawal Time: 0 hours 12 minutes 9 seconds  Total Procedure Duration: 0 hours 16 minutes 48 seconds  Findings:                 A 2 mm polyp was found in the cecum. The polyp was                            removed with a cold snare. Resection and retrieval                            were complete.                           Multiple diverticula were found in the left colon                            and right colon.  Internal hemorrhoids were found during retroflexion.                           The exam was otherwise without abnormality on                            direct and retroflexion views. Complications:            No immediate complications. Estimated blood loss:                            None. Estimated Blood Loss:     Estimated blood loss: none. Impression:               - One 2 mm polyp in the cecum, removed with a cold                            snare. Resected and retrieved.                           - Diverticulosis in the left colon and in the right                            colon.                           - Internal hemorrhoids.                           - The examination was otherwise normal on direct                            and retroflexion views. Recommendation:           - Repeat colonoscopy in 5 years for surveillance.                           - Patient  has a contact number available for                            emergencies. The signs and symptoms of potential                            delayed complications were discussed with the                            patient. Return to normal activities tomorrow.                            Written discharge instructions were provided to the                            patient.                           - Resume previous diet.                           -  Continue present medications.                           - Await pathology results. Docia Chuck. Henrene Pastor, MD 02/25/2017 10:18:02 AM This report has been signed electronically.

## 2017-02-25 NOTE — Progress Notes (Signed)
Called to room to assist during endoscopic procedure.  Patient ID and intended procedure confirmed with present staff. Received instructions for my participation in the procedure from the performing physician.  

## 2017-02-25 NOTE — Patient Instructions (Signed)
  Handouts given: polyps, Hemorrhoids, and Diverticulosis   YOU HAD AN ENDOSCOPIC PROCEDURE TODAY AT [facility]:   Refer to the procedure report that was given to you for any specific questions about what was found during the examination.  If the procedure report does not answer your questions, please call your gastroenterologist to clarify.  If you requested that your care partner not be given the details of your procedure findings, then the procedure report has been included in a sealed envelope for you to review at your convenience later.  YOU SHOULD EXPECT: Some feelings of bloating in the abdomen. Passage of more gas than usual.  Walking can help get rid of the air that was put into your GI tract during the procedure and reduce the bloating. If you had a lower endoscopy (such as a colonoscopy or flexible sigmoidoscopy) you may notice spotting of blood in your stool or on the toilet paper. If you underwent a bowel prep for your procedure, you may not have a normal bowel movement for a few days.  Please Note:  You might notice some irritation and congestion in your nose or some drainage.  This is from the oxygen used during your procedure.  There is no need for concern and it should clear up in a day or so.  SYMPTOMS TO REPORT IMMEDIATELY:   Following lower endoscopy (colonoscopy or flexible sigmoidoscopy):  Excessive amounts of blood in the stool  Significant tenderness or worsening of abdominal pains  Swelling of the abdomen that is new, acute  Fever of 100F or higher  For urgent or emergent issues, a gastroenterologist can be reached at any hour by calling [phone].   DIET:  We do recommend a small meal at first, but then you may proceed to your regular diet.  Drink plenty of fluids but you should avoid alcoholic beverages for 24 hours.  ACTIVITY:  You should plan to take it easy for the rest of today and you should NOT DRIVE or use heavy machinery until tomorrow  (because of the sedation medicines used during the test).    FOLLOW UP: Our staff will call the number listed on your records the next business day following your procedure to check on you and address any questions or concerns that you may have regarding the information given to you following your procedure. If we do not reach you, we will leave a message.  However, if you are feeling well and you are not experiencing any problems, there is no need to return our call.  We will assume that you have returned to your regular daily activities without incident.  If any biopsies were taken you will be contacted by phone or by letter within the next 1-3 weeks.  Please call us at [phone] if you have not heard about the biopsies in 3 weeks.    SIGNATURES/CONFIDENTIALITY: You and/or your care partner have signed paperwork which will be entered into your electronic medical record.  These signatures attest to the fact that that the information above on your After Visit Summary has been reviewed and is understood.  Full responsibility of the confidentiality of this discharge information lies with you and/or your care-partner.

## 2017-02-25 NOTE — Progress Notes (Signed)
Report to PACU, RN, vss, BBS= Clear.  

## 2017-02-26 ENCOUNTER — Telehealth: Payer: Self-pay | Admitting: *Deleted

## 2017-02-26 NOTE — Telephone Encounter (Signed)
  Follow up Call-  Call back number 02/25/2017  Post procedure Call Back phone  # [phone]  Permission to leave phone message Yes  Some recent data might be hidden     Patient questions:  Do you have a fever, pain , or abdominal swelling? No. Pain Score  0 *  Have you tolerated food without any problems? Yes.    Have you been able to return to your normal activities? Yes.    Do you have any questions about your discharge instructions: Diet   No. Medications  No. Follow up visit  No.  Do you have questions or concerns about your Care? No.  Actions: * If pain score is 4 or above: No action needed, pain <4.

## 2017-03-03 ENCOUNTER — Encounter: Payer: Self-pay | Admitting: Internal Medicine

## 2017-03-18 ENCOUNTER — Other Ambulatory Visit (INDEPENDENT_AMBULATORY_CARE_PROVIDER_SITE_OTHER): Payer: Medicare Other

## 2017-03-18 ENCOUNTER — Other Ambulatory Visit: Payer: Self-pay | Admitting: Internal Medicine

## 2017-03-18 DIAGNOSIS — Z1159 Encounter for screening for other viral diseases: Secondary | ICD-10-CM

## 2017-03-18 DIAGNOSIS — E7800 Pure hypercholesterolemia, unspecified: Secondary | ICD-10-CM

## 2017-03-18 DIAGNOSIS — I10 Essential (primary) hypertension: Secondary | ICD-10-CM | POA: Diagnosis not present

## 2017-03-18 LAB — HEPATIC FUNCTION PANEL
ALK PHOS: 64 U/L (ref 39–117)
ALT: 12 U/L (ref 0–35)
AST: 12 U/L (ref 0–37)
Albumin: 4.5 g/dL (ref 3.5–5.2)
BILIRUBIN DIRECT: 0.2 mg/dL (ref 0.0–0.3)
TOTAL PROTEIN: 7.3 g/dL (ref 6.0–8.3)
Total Bilirubin: 0.7 mg/dL (ref 0.2–1.2)

## 2017-03-18 LAB — LIPID PANEL
CHOL/HDL RATIO: 2
Cholesterol: 215 mg/dL — ABNORMAL HIGH (ref 0–200)
HDL: 88.7 mg/dL (ref >39.00)
LDL Cholesterol: 105 mg/dL — ABNORMAL HIGH (ref 0–99)
NONHDL: 126.64
Triglycerides: 109 mg/dL (ref 0.0–149.0)
VLDL: 21.8 mg/dL (ref 0.0–40.0)

## 2017-03-18 LAB — BASIC METABOLIC PANEL
BUN: 23 mg/dL (ref 6–23)
CALCIUM: 9.9 mg/dL (ref 8.4–10.5)
CO2: 25 meq/L (ref 19–32)
Chloride: 104 mEq/L (ref 96–112)
Creatinine, Ser: 1.17 mg/dL (ref 0.40–1.20)
GFR: 48.57 mL/min — AB (ref >60.00)
Glucose, Bld: 113 mg/dL — ABNORMAL HIGH (ref 70–99)
Potassium: 4.2 mEq/L (ref 3.5–5.1)
SODIUM: 140 meq/L (ref 135–145)

## 2017-03-19 ENCOUNTER — Encounter: Payer: Self-pay | Admitting: Internal Medicine

## 2017-03-19 LAB — HEPATITIS C ANTIBODY
HEP C AB: NONREACTIVE
SIGNAL TO CUT-OFF: 0.01 (ref <1.00)

## 2017-03-21 ENCOUNTER — Encounter: Payer: Self-pay | Admitting: Internal Medicine

## 2017-03-21 ENCOUNTER — Ambulatory Visit (INDEPENDENT_AMBULATORY_CARE_PROVIDER_SITE_OTHER): Payer: Medicare Other | Admitting: Internal Medicine

## 2017-03-21 VITALS — BP 146/88 | HR 76 | Temp 98.6°F | Resp 14 | Ht 67.0 in | Wt 167.4 lb

## 2017-03-21 DIAGNOSIS — I10 Essential (primary) hypertension: Secondary | ICD-10-CM

## 2017-03-21 DIAGNOSIS — Z1231 Encounter for screening mammogram for malignant neoplasm of breast: Secondary | ICD-10-CM

## 2017-03-21 DIAGNOSIS — E7800 Pure hypercholesterolemia, unspecified: Secondary | ICD-10-CM

## 2017-03-21 DIAGNOSIS — I701 Atherosclerosis of renal artery: Secondary | ICD-10-CM | POA: Diagnosis not present

## 2017-03-21 DIAGNOSIS — Z23 Encounter for immunization: Secondary | ICD-10-CM | POA: Diagnosis not present

## 2017-03-21 DIAGNOSIS — Z1239 Encounter for other screening for malignant neoplasm of breast: Secondary | ICD-10-CM

## 2017-03-21 NOTE — Progress Notes (Signed)
Patient ID: Shelby Spencer, female   DOB: 1945-12-15, 71 y.o.   MRN: [id number]   Subjective:    Patient ID: Shelby Spencer, female    DOB: 1946-04-03, 71 y.o.   MRN: [id number]  HPI  Patient here for a scheduled follow up.  She reports she is doing well.  Feels good.  Stays active.  No chest pain.  No sob.  No acid reflux.  No abdominal pain.  Bowels moving.  Had f/u with Dr Bridgett Larsson 12/2016 - f/u RAS.  Felt stable, recommended f/u one year renal duplex.  Discussed her recent labs.  Discussed cholesterol.  Had colonoscopy 02/25/17 - tubular adenoma.  Recommended f/u in 5 years.     Past Medical History:  Diagnosis Date  . Cataract   . History of colon polyps   . History of prolapse of bladder   . History of pyloric stenosis   . Hypercholesterolemia   . Hypertension    Past Surgical History:  Procedure Laterality Date  . CATARACT EXTRACTION, BILATERAL    . TONSILLECTOMY    . TUBAL LIGATION     Family History  Problem Relation Age of Onset  . Hypertension Mother   . Heart disease Father   . Colon cancer Neg Hx   . Esophageal cancer Neg Hx   . Pancreatic cancer Neg Hx   . Rectal cancer Neg Hx   . Stomach cancer Neg Hx    Social History   Social History  . Marital status: Married    Spouse name: N/A  . Number of children: N/A  . Years of education: N/A   Social History Main Topics  . Smoking status: Never Smoker  . Smokeless tobacco: Never Used  . Alcohol use 3.0 oz/week    5 Glasses of wine per week  . Drug use: No  . Sexual activity: Not Currently   Other Topics Concern  . None   Social History Narrative  . None    Outpatient Encounter Prescriptions as of 03/21/2017  Medication Sig  . amLODipine (NORVASC) 5 MG tablet Take 1 tablet (5 mg total) by mouth daily.  . Calcium Carbonate-Vitamin D (CALCIUM + D PO) Take by mouth.  . doxycycline (VIBRAMYCIN) 50 MG capsule Take 50 mg by mouth daily.  Marland Kitchen losartan (COZAAR) 100 MG tablet Take 1 tablet (100 mg total) by mouth  daily.  . rosuvastatin (CRESTOR) 5 MG tablet Take one tablet q day (Patient taking differently: 2 (two) times a week. Take one tablet q day)  . [DISCONTINUED] amLODipine (NORVASC) 5 MG tablet TAKE 1 TABLET BY MOUTH ONCE DAILY   Facility-Administered Encounter Medications as of 03/21/2017  Medication  . 0.9 %  sodium chloride infusion    Review of Systems  Constitutional: Negative for appetite change and unexpected weight change.  HENT: Negative for congestion and sinus pressure.   Respiratory: Negative for cough, chest tightness and shortness of breath.   Cardiovascular: Negative for chest pain, palpitations and leg swelling.  Gastrointestinal: Negative for abdominal pain, diarrhea, nausea and vomiting.  Genitourinary: Negative for difficulty urinating and dysuria.  Musculoskeletal: Negative for back pain and myalgias.  Skin: Negative for color change and rash.  Neurological: Negative for dizziness, light-headedness and headaches.  Psychiatric/Behavioral: Negative for agitation and dysphoric mood.       Objective:     Blood pressure rechecked by me:  146/88  Physical Exam  Constitutional: She appears well-developed and well-nourished. No distress.  HENT:  Nose: Nose normal.  Mouth/Throat: Oropharynx is clear and moist.  Neck: Neck supple. No thyromegaly present.  Cardiovascular: Normal rate and regular rhythm.   Pulmonary/Chest: Breath sounds normal. No respiratory distress. She has no wheezes.  Abdominal: Soft. Bowel sounds are normal. There is no tenderness.  Musculoskeletal: She exhibits no edema or tenderness.  Lymphadenopathy:    She has no cervical adenopathy.  Skin: No rash noted. No erythema.  Psychiatric: She has a normal mood and affect. Her behavior is normal.    BP (!) 146/88   Pulse 76   Temp 98.6 F (37 C) (Oral)   Resp 14   Ht 5\' 7"  (1.702 m)   Wt 167 lb 6.4 oz (75.9 kg)   SpO2 97%   BMI 26.22 kg/m  Wt Readings from Last 3 Encounters:  03/21/17 167  lb 6.4 oz (75.9 kg)  02/25/17 167 lb (75.8 kg)  02/11/17 167 lb 3.2 oz (75.8 kg)     Lab Results  Component Value Date   WBC 5.6 11/12/2016   HGB 13.7 11/12/2016   HCT 40.8 11/12/2016   PLT 367.0 11/12/2016   GLUCOSE 113 (H) 03/18/2017   CHOL 215 (H) 03/18/2017   TRIG 109.0 03/18/2017   HDL 88.70 03/18/2017   LDLCALC 105 (H) 03/18/2017   ALT 12 03/18/2017   AST 12 03/18/2017   NA 140 03/18/2017   K 4.2 03/18/2017   CL 104 03/18/2017   CREATININE 1.17 03/18/2017   BUN 23 03/18/2017   CO2 25 03/18/2017   TSH 3.90 11/12/2016       Assessment & Plan:   Problem List Items Addressed This Visit    Hypercholesterolemia    On crestor.  Will increase to 3 days per week.  Follow lipid panel and liver function tests.  Low cholesterol diet and exercise.        Hypertension, essential    Blood pressure elevated today.  Discussed with her.  Continue same medication regimen.  Follow pressures.  Follow metabolic panel.  Have her spot check her pressure.  Get her back in soon to reassess.        Renal artery stenosis ([facility])    Just evaluated by Dr Bridgett Larsson.  Felt stable.  Recommended f/u renal duplex in 12 months.         Other Visit Diagnoses    Screening for breast cancer    -  Primary   Relevant Orders   MM SCREENING BREAST TOMO BILATERAL   Encounter for immunization       Relevant Orders   Flu vaccine HIGH DOSE PF (Completed)       Einar Pheasant, MD

## 2017-03-23 ENCOUNTER — Encounter: Payer: Self-pay | Admitting: Internal Medicine

## 2017-03-23 NOTE — Assessment & Plan Note (Signed)
On crestor.  Will increase to 3 days per week.  Follow lipid panel and liver function tests.  Low cholesterol diet and exercise.

## 2017-03-23 NOTE — Assessment & Plan Note (Signed)
Blood pressure elevated today.  Discussed with her.  Continue same medication regimen.  Follow pressures.  Follow metabolic panel.  Have her spot check her pressure.  Get her back in soon to reassess.

## 2017-03-23 NOTE — Assessment & Plan Note (Signed)
Just evaluated by Dr Bridgett Larsson.  Felt stable.  Recommended f/u renal duplex in 12 months.

## 2017-04-04 ENCOUNTER — Encounter: Payer: Self-pay | Admitting: Internal Medicine

## 2017-04-15 ENCOUNTER — Other Ambulatory Visit: Payer: Self-pay | Admitting: Internal Medicine

## 2017-04-30 DIAGNOSIS — Z1231 Encounter for screening mammogram for malignant neoplasm of breast: Secondary | ICD-10-CM | POA: Diagnosis not present

## 2017-05-14 ENCOUNTER — Other Ambulatory Visit: Payer: Self-pay | Admitting: Internal Medicine

## 2017-06-06 ENCOUNTER — Encounter: Payer: Self-pay | Admitting: Internal Medicine

## 2017-06-06 ENCOUNTER — Ambulatory Visit (INDEPENDENT_AMBULATORY_CARE_PROVIDER_SITE_OTHER): Payer: Medicare Other | Admitting: Internal Medicine

## 2017-06-06 VITALS — BP 142/70 | HR 72 | Temp 97.6°F | Ht 67.0 in | Wt 168.4 lb

## 2017-06-06 DIAGNOSIS — I701 Atherosclerosis of renal artery: Secondary | ICD-10-CM | POA: Diagnosis not present

## 2017-06-06 DIAGNOSIS — E7800 Pure hypercholesterolemia, unspecified: Secondary | ICD-10-CM

## 2017-06-06 DIAGNOSIS — I10 Essential (primary) hypertension: Secondary | ICD-10-CM | POA: Diagnosis not present

## 2017-06-06 MED ORDER — AMLODIPINE BESYLATE 5 MG PO TABS
5.0000 mg | ORAL_TABLET | Freq: Every day | ORAL | 1 refills | Status: DC
Start: 2017-06-06 — End: 2017-09-12

## 2017-06-06 NOTE — Progress Notes (Signed)
Pre visit review using our clinic review tool, if applicable. No additional management support is needed unless otherwise documented below in the visit note. 

## 2017-06-06 NOTE — Progress Notes (Signed)
Patient ID: Shelby Spencer, female   DOB: Jan 31, 1946, 72 y.o.   MRN: [id number]   Subjective:    Patient ID: Shelby Spencer, female    DOB: [birth year], 73 y.o.   MRN: [id number]  HPI  Patient here for a scheduled follow up.  She reports she is doing well.  Feels good.  Two weeks ago had some chest congestion.  Better now.  No chest pain.  No sob.  No acid reflux.  No abdominal pain.  Bowels moving.  No urine change.  Stays active.     Past Medical History:  Diagnosis Date  . Cataract   . History of colon polyps   . History of prolapse of bladder   . History of pyloric stenosis   . Hypercholesterolemia   . Hypertension    Past Surgical History:  Procedure Laterality Date  . CATARACT EXTRACTION, BILATERAL    . TONSILLECTOMY    . TUBAL LIGATION     Family History  Problem Relation Age of Onset  . Hypertension Mother   . Heart disease Father   . Colon cancer Neg Hx   . Esophageal cancer Neg Hx   . Pancreatic cancer Neg Hx   . Rectal cancer Neg Hx   . Stomach cancer Neg Hx    Social History   Socioeconomic History  . Marital status: Married    Spouse name: None  . Number of children: None  . Years of education: None  . Highest education level: None  Social Needs  . Financial resource strain: None  . Food insecurity - worry: None  . Food insecurity - inability: None  . Transportation needs - medical: None  . Transportation needs - non-medical: None  Occupational History  . None  Tobacco Use  . Smoking status: Never Smoker  . Smokeless tobacco: Never Used  Substance and Sexual Activity  . Alcohol use: Yes    Alcohol/week: 3.0 oz    Types: 5 Glasses of wine per week  . Drug use: No  . Sexual activity: Not Currently  Other Topics Concern  . None  Social History Narrative  . None    Outpatient Encounter Medications as of 06/06/2017  Medication Sig  . amLODipine (NORVASC) 5 MG tablet Take 1 tablet (5 mg total) by mouth daily.  . Calcium Carbonate-Vitamin D (CALCIUM  + D PO) Take by mouth.  . doxycycline (VIBRAMYCIN) 50 MG capsule Take 50 mg by mouth daily.  Marland Kitchen losartan (COZAAR) 100 MG tablet TAKE 1 TABLET BY MOUTH ONCE DAILY  . rosuvastatin (CRESTOR) 5 MG tablet TAKE 1 TABLET BY MOUTH ONCE DAILY  . [DISCONTINUED] amLODipine (NORVASC) 5 MG tablet TAKE 1 TABLET BY MOUTH ONCE DAILY  . [DISCONTINUED] amLODipine (NORVASC) 5 MG tablet Take 1 tablet (5 mg total) by mouth daily.   Facility-Administered Encounter Medications as of 06/06/2017  Medication  . 0.9 %  sodium chloride infusion    Review of Systems  Constitutional: Negative for appetite change and unexpected weight change.  HENT: Negative for congestion and sinus pressure.   Respiratory: Negative for cough, chest tightness and shortness of breath.   Cardiovascular: Negative for chest pain, palpitations and leg swelling.  Gastrointestinal: Negative for abdominal pain, diarrhea, nausea and vomiting.  Genitourinary: Negative for difficulty urinating and dysuria.  Musculoskeletal: Negative for back pain and joint swelling.  Skin: Negative for color change and rash.  Neurological: Negative for dizziness, light-headedness and headaches.  Psychiatric/Behavioral: Negative for agitation and dysphoric mood.  Objective:    Physical Exam  Constitutional: She appears well-developed and well-nourished. No distress.  HENT:  Nose: Nose normal.  Mouth/Throat: Oropharynx is clear and moist.  Neck: Neck supple. No thyromegaly present.  Cardiovascular: Normal rate and regular rhythm.  Pulmonary/Chest: Breath sounds normal. No respiratory distress. She has no wheezes.  Abdominal: Soft. Bowel sounds are normal. There is no tenderness.  Musculoskeletal: She exhibits no edema or tenderness.  Lymphadenopathy:    She has no cervical adenopathy.  Skin: No rash noted. No erythema.  Psychiatric: She has a normal mood and affect. Her behavior is normal.    BP (!) 142/70   Pulse 72   Temp 97.6 F (36.4 C)  (Oral)   Ht 5\' 7"  (1.702 m)   Wt 168 lb 6.4 oz (76.4 kg)   SpO2 98%   BMI 26.38 kg/m  Wt Readings from Last 3 Encounters:  06/06/17 168 lb 6.4 oz (76.4 kg)  03/21/17 167 lb 6.4 oz (75.9 kg)  02/25/17 167 lb (75.8 kg)     Lab Results  Component Value Date   WBC 5.6 11/12/2016   HGB 13.7 11/12/2016   HCT 40.8 11/12/2016   PLT 367.0 11/12/2016   GLUCOSE 113 (H) 03/18/2017   CHOL 215 (H) 03/18/2017   TRIG 109.0 03/18/2017   HDL 88.70 03/18/2017   LDLCALC 105 (H) 03/18/2017   ALT 12 03/18/2017   AST 12 03/18/2017   NA 140 03/18/2017   K 4.2 03/18/2017   CL 104 03/18/2017   CREATININE 1.17 03/18/2017   BUN 23 03/18/2017   CO2 25 03/18/2017   TSH 3.90 11/12/2016       Assessment & Plan:   Problem List Items Addressed This Visit    Hypercholesterolemia    On crestor.  Low cholesterol diet and exercise.  Follow lipid panel and liver function tests.   Lab Results  Component Value Date   CHOL 215 (H) 03/18/2017   HDL 88.70 03/18/2017   LDLCALC 105 (H) 03/18/2017   TRIG 109.0 03/18/2017   CHOLHDL 2 03/18/2017        Relevant Medications   amLODipine (NORVASC) 5 MG tablet   Other Relevant Orders   Hepatic function panel   Lipid panel   Hypertension, essential - Primary    Blood pressure as outlined today.  Have her spot check her pressure.  Send in readings.  Same medication regimen.  Follow.        Relevant Medications   amLODipine (NORVASC) 5 MG tablet   Other Relevant Orders   Basic metabolic panel   Renal artery stenosis [facility])    Recently evaluated by Dr Bridgett Larsson.  Felt stable.  Recommended f/u renal duplex in 12 months.  Follow.       Relevant Medications   amLODipine (NORVASC) 5 MG tablet       Einar Pheasant, MD

## 2017-06-08 ENCOUNTER — Encounter: Payer: Self-pay | Admitting: Internal Medicine

## 2017-06-08 NOTE — Assessment & Plan Note (Signed)
Recently evaluated by Dr Bridgett Larsson.  Felt stable.  Recommended f/u renal duplex in 12 months.  Follow.

## 2017-06-08 NOTE — Assessment & Plan Note (Signed)
Blood pressure as outlined today.  Have her spot check her pressure.  Send in readings.  Same medication regimen.  Follow.

## 2017-06-08 NOTE — Assessment & Plan Note (Signed)
On crestor.  Low cholesterol diet and exercise.  Follow lipid panel and liver function tests.   Lab Results  Component Value Date   CHOL 215 (H) 03/18/2017   HDL 88.70 03/18/2017   LDLCALC 105 (H) 03/18/2017   TRIG 109.0 03/18/2017   CHOLHDL 2 03/18/2017

## 2017-09-08 ENCOUNTER — Encounter: Payer: Self-pay | Admitting: Internal Medicine

## 2017-09-10 ENCOUNTER — Other Ambulatory Visit (INDEPENDENT_AMBULATORY_CARE_PROVIDER_SITE_OTHER): Payer: Medicare Other

## 2017-09-10 DIAGNOSIS — I10 Essential (primary) hypertension: Secondary | ICD-10-CM | POA: Diagnosis not present

## 2017-09-10 DIAGNOSIS — E7800 Pure hypercholesterolemia, unspecified: Secondary | ICD-10-CM

## 2017-09-10 LAB — HEPATIC FUNCTION PANEL
ALBUMIN: 4.3 g/dL (ref 3.5–5.2)
ALK PHOS: 63 U/L (ref 39–117)
ALT: 10 U/L (ref 0–35)
AST: 12 U/L (ref 0–37)
BILIRUBIN DIRECT: 0.1 mg/dL (ref 0.0–0.3)
TOTAL PROTEIN: 7 g/dL (ref 6.0–8.3)
Total Bilirubin: 0.7 mg/dL (ref 0.2–1.2)

## 2017-09-10 LAB — LIPID PANEL
CHOL/HDL RATIO: 3
Cholesterol: 250 mg/dL — ABNORMAL HIGH (ref 0–200)
HDL: 79.8 mg/dL (ref >39.00)
LDL CALC: 153 mg/dL — AB (ref 0–99)
NONHDL: 170.29
Triglycerides: 87 mg/dL (ref 0.0–149.0)
VLDL: 17.4 mg/dL (ref 0.0–40.0)

## 2017-09-10 LAB — BASIC METABOLIC PANEL
BUN: 24 mg/dL — ABNORMAL HIGH (ref 6–23)
CHLORIDE: 105 meq/L (ref 96–112)
CO2: 26 meq/L (ref 19–32)
Calcium: 9.5 mg/dL (ref 8.4–10.5)
Creatinine, Ser: 1.14 mg/dL (ref 0.40–1.20)
GFR: 49.97 mL/min — ABNORMAL LOW (ref >60.00)
GLUCOSE: 94 mg/dL (ref 70–99)
Potassium: 4.5 mEq/L (ref 3.5–5.1)
Sodium: 139 mEq/L (ref 135–145)

## 2017-09-11 ENCOUNTER — Encounter: Payer: Self-pay | Admitting: Internal Medicine

## 2017-09-12 ENCOUNTER — Encounter: Payer: Self-pay | Admitting: Internal Medicine

## 2017-09-12 ENCOUNTER — Ambulatory Visit (INDEPENDENT_AMBULATORY_CARE_PROVIDER_SITE_OTHER): Payer: Medicare Other | Admitting: Internal Medicine

## 2017-09-12 DIAGNOSIS — I10 Essential (primary) hypertension: Secondary | ICD-10-CM

## 2017-09-12 DIAGNOSIS — I701 Atherosclerosis of renal artery: Secondary | ICD-10-CM

## 2017-09-12 DIAGNOSIS — E7800 Pure hypercholesterolemia, unspecified: Secondary | ICD-10-CM

## 2017-09-12 MED ORDER — AMLODIPINE BESYLATE 5 MG PO TABS: 5.0000 mg | ORAL_TABLET | Freq: Two times a day (BID) | ORAL | 1 refills | Status: DC

## 2017-09-12 MED ORDER — PRAVASTATIN SODIUM 10 MG PO TABS: 10.0000 mg | ORAL_TABLET | Freq: Every day | ORAL | 1 refills | Status: DC

## 2017-09-12 NOTE — Progress Notes (Signed)
Patient ID: Shelby Spencer, female   DOB: 01-15-1946, 72 y.o.   MRN: [id number]   Subjective:    Patient ID: Shelby Spencer, female    DOB: 1945-07-14, 72 y.o.   MRN: [id number]  HPI  Patient here for a scheduled follow up.  She was previously started on crestor.  Started having muscle aches and some mild weakness, so she stopped.  Symptoms resolved.  Discussed recent cholesterol labs.  Discussed her calculated cholesterol risk.  Discussed trying another statin medication.  Stays active.  No chest pain.  No sob.  No acid reflux.  No abdominal pain.  Bowels moving.  No urine change.  Blood pressure remaining a little elevated.     Past Medical History:  Diagnosis Date  . Cataract   . History of colon polyps   . History of prolapse of bladder   . History of pyloric stenosis   . Hypercholesterolemia   . Hypertension    Past Surgical History:  Procedure Laterality Date  . CATARACT EXTRACTION, BILATERAL    . TONSILLECTOMY    . TUBAL LIGATION     Family History  Problem Relation Age of Onset  . Hypertension Mother   . Heart disease Father   . Colon cancer Neg Hx   . Esophageal cancer Neg Hx   . Pancreatic cancer Neg Hx   . Rectal cancer Neg Hx   . Stomach cancer Neg Hx    Social History   Socioeconomic History  . Marital status: Married    Spouse name: Not on file  . Number of children: Not on file  . Years of education: Not on file  . Highest education level: Not on file  Occupational History  . Not on file  Social Needs  . Financial resource strain: Not on file  . Food insecurity:    Worry: Not on file    Inability: Not on file  . Transportation needs:    Medical: Not on file    Non-medical: Not on file  Tobacco Use  . Smoking status: Never Smoker  . Smokeless tobacco: Never Used  Substance and Sexual Activity  . Alcohol use: Yes    Alcohol/week: 3.0 oz    Types: 5 Glasses of wine per week  . Drug use: No  . Sexual activity: Not Currently  Lifestyle  . Physical  activity:    Days per week: Not on file    Minutes per session: Not on file  . Stress: Not on file  Relationships  . Social connections:    Talks on phone: Not on file    Gets together: Not on file    Attends religious service: Not on file    Active member of club or organization: Not on file    Attends meetings of clubs or organizations: Not on file    Relationship status: Not on file  Other Topics Concern  . Not on file  Social History Narrative  . Not on file    Outpatient Encounter Medications as of 09/12/2017  Medication Sig  . amLODipine (NORVASC) 5 MG tablet Take 1 tablet (5 mg total) by mouth 2 (two) times daily.  . Calcium Carbonate-Vitamin D (CALCIUM + D PO) Take by mouth.  . doxycycline (VIBRAMYCIN) 50 MG capsule Take 50 mg by mouth daily.  Marland Kitchen losartan (COZAAR) 100 MG tablet TAKE 1 TABLET BY MOUTH ONCE DAILY  . pravastatin (PRAVACHOL) 10 MG tablet Take 1 tablet (10 mg total) by mouth daily.  . [  DISCONTINUED] amLODipine (NORVASC) 5 MG tablet Take 1 tablet (5 mg total) by mouth daily.  . [DISCONTINUED] rosuvastatin (CRESTOR) 5 MG tablet TAKE 1 TABLET BY MOUTH ONCE DAILY (Patient not taking: Reported on 09/12/2017)   Facility-Administered Encounter Medications as of 09/12/2017  Medication  . 0.9 %  sodium chloride infusion    Review of Systems  Constitutional: Negative for appetite change and unexpected weight change.  HENT: Negative for congestion and sinus pressure.   Respiratory: Negative for cough, chest tightness and shortness of breath.   Cardiovascular: Negative for chest pain, palpitations and leg swelling.  Gastrointestinal: Negative for abdominal pain, diarrhea, nausea and vomiting.  Genitourinary: Negative for difficulty urinating and dysuria.  Musculoskeletal: Negative for joint swelling and myalgias.  Skin: Negative for color change and rash.  Neurological: Negative for dizziness, light-headedness and headaches.  Psychiatric/Behavioral: Negative for  agitation and dysphoric mood.       Objective:    Physical Exam  Constitutional: She appears well-developed and well-nourished. No distress.  HENT:  Nose: Nose normal.  Mouth/Throat: Oropharynx is clear and moist.  Neck: Neck supple. No thyromegaly present.  Cardiovascular: Normal rate and regular rhythm.  Pulmonary/Chest: Breath sounds normal. No respiratory distress. She has no wheezes.  Abdominal: Soft. Bowel sounds are normal. There is no tenderness.  Musculoskeletal: She exhibits no edema or tenderness.  Lymphadenopathy:    She has no cervical adenopathy.  Skin: No rash noted. No erythema.  Psychiatric: She has a normal mood and affect. Her behavior is normal.    BP 138/80 (BP Location: Left Arm, Patient Position: Sitting, Cuff Size: Normal)   Pulse 64   Temp 98.1 F (36.7 C) (Oral)   Resp 18   Wt 166 lb 3.2 oz (75.4 kg)   SpO2 97%   BMI 26.03 kg/m  Wt Readings from Last 3 Encounters:  09/12/17 166 lb 3.2 oz (75.4 kg)  06/06/17 168 lb 6.4 oz (76.4 kg)  03/21/17 167 lb 6.4 oz (75.9 kg)     Lab Results  Component Value Date   WBC 5.6 11/12/2016   HGB 13.7 11/12/2016   HCT 40.8 11/12/2016   PLT 367.0 11/12/2016   GLUCOSE 94 09/10/2017   CHOL 250 (H) 09/10/2017   TRIG 87.0 09/10/2017   HDL 79.80 09/10/2017   LDLCALC 153 (H) 09/10/2017   ALT 10 09/10/2017   AST 12 09/10/2017   NA 139 09/10/2017   K 4.5 09/10/2017   CL 105 09/10/2017   CREATININE 1.14 09/10/2017   BUN 24 (H) 09/10/2017   CO2 26 09/10/2017   TSH 3.90 11/12/2016       Assessment & Plan:   Problem List Items Addressed This Visit    Hypercholesterolemia    On crestor.  Low cholesterol diet and exercise.  Had intolerance to crestor.  Willing to try another cholesterol medication.  Start pravastatin 2x/week initially and then increase as tolerated.  Follow lipid panel and liver function tests.        Relevant Medications   amLODipine (NORVASC) 5 MG tablet   pravastatin (PRAVACHOL) 10 MG  tablet   Other Relevant Orders   Hepatic function panel   Hypertension, essential    Blood pressure remains elevated.  Increase amlodipine to 5mg  bid.  Continue losartan.  Follow pressures.  Follow metabolic panel.       Relevant Medications   amLODipine (NORVASC) 5 MG tablet   pravastatin (PRAVACHOL) 10 MG tablet   Renal artery stenosis ([place])    Followed by  Dr Bridgett Larsson.  Stable.  Recommended f/u duplex in 12 months after last check.        Relevant Medications   amLODipine (NORVASC) 5 MG tablet   pravastatin (PRAVACHOL) 10 MG tablet       Einar Pheasant, MD

## 2017-09-14 ENCOUNTER — Encounter: Payer: Self-pay | Admitting: Internal Medicine

## 2017-09-14 NOTE — Assessment & Plan Note (Signed)
Blood pressure remains elevated.  Increase amlodipine to 5mg  bid.  Continue losartan.  Follow pressures.  Follow metabolic panel.

## 2017-09-14 NOTE — Assessment & Plan Note (Signed)
On crestor.  Low cholesterol diet and exercise.  Had intolerance to crestor.  Willing to try another cholesterol medication.  Start pravastatin 2x/week initially and then increase as tolerated.  Follow lipid panel and liver function tests.

## 2017-09-14 NOTE — Assessment & Plan Note (Signed)
Followed by Dr Chen.  Stable.  Recommended f/u duplex in 12 months after last check.   

## 2017-10-24 ENCOUNTER — Other Ambulatory Visit: Payer: Self-pay | Admitting: Internal Medicine

## 2017-10-24 ENCOUNTER — Other Ambulatory Visit (INDEPENDENT_AMBULATORY_CARE_PROVIDER_SITE_OTHER): Payer: Medicare Other

## 2017-10-24 DIAGNOSIS — E7800 Pure hypercholesterolemia, unspecified: Secondary | ICD-10-CM | POA: Diagnosis not present

## 2017-10-24 LAB — HEPATIC FUNCTION PANEL
ALK PHOS: 70 U/L (ref 39–117)
ALT: 11 U/L (ref 0–35)
AST: 12 U/L (ref 0–37)
Albumin: 4.4 g/dL (ref 3.5–5.2)
BILIRUBIN DIRECT: 0.1 mg/dL (ref 0.0–0.3)
TOTAL PROTEIN: 7.1 g/dL (ref 6.0–8.3)
Total Bilirubin: 0.5 mg/dL (ref 0.2–1.2)

## 2017-10-28 ENCOUNTER — Encounter: Payer: Self-pay | Admitting: Internal Medicine

## 2017-11-17 ENCOUNTER — Ambulatory Visit: Payer: Medicare Other

## 2017-11-27 ENCOUNTER — Encounter: Payer: Self-pay | Admitting: Internal Medicine

## 2017-11-27 ENCOUNTER — Ambulatory Visit (INDEPENDENT_AMBULATORY_CARE_PROVIDER_SITE_OTHER): Payer: Medicare Other | Admitting: Internal Medicine

## 2017-11-27 VITALS — BP 138/80 | HR 67 | Temp 98.3°F | Resp 16 | Wt 167.5 lb

## 2017-11-27 DIAGNOSIS — I10 Essential (primary) hypertension: Secondary | ICD-10-CM

## 2017-11-27 DIAGNOSIS — E7800 Pure hypercholesterolemia, unspecified: Secondary | ICD-10-CM

## 2017-11-27 DIAGNOSIS — I701 Atherosclerosis of renal artery: Secondary | ICD-10-CM

## 2017-11-27 DIAGNOSIS — R609 Edema, unspecified: Secondary | ICD-10-CM

## 2017-11-27 NOTE — Progress Notes (Signed)
Patient ID: IYANIA DENNE, female   DOB: 1945-10-01, 72 y.o.   MRN: [id number]   Subjective:    Patient ID: TIZIANA CISLO, female    DOB: 1945-11-10, 72 y.o.   MRN: [id number]  HPI  Patient here for a scheduled follow up.  She just returned from a trip to the [place].  Very active.  No chest pain.  No sob.  No acid reflux.  No abdominal pain.  Bowels moving.  No urine change.  Some ankle swelling.  Worse with the increased hiking, etc.  Better now.  Overall feels good.  During her trip, she was off taking her medication.  Skipped some doses.     Past Medical History:  Diagnosis Date  . Cataract   . History of colon polyps   . History of prolapse of bladder   . History of pyloric stenosis   . Hypercholesterolemia   . Hypertension    Past Surgical History:  Procedure Laterality Date  . CATARACT EXTRACTION, BILATERAL    . TONSILLECTOMY    . TUBAL LIGATION     Family History  Problem Relation Age of Onset  . Hypertension Mother   . Heart disease Father   . Colon cancer Neg Hx   . Esophageal cancer Neg Hx   . Pancreatic cancer Neg Hx   . Rectal cancer Neg Hx   . Stomach cancer Neg Hx    Social History   Socioeconomic History  . Marital status: Married    Spouse name: Not on file  . Number of children: Not on file  . Years of education: Not on file  . Highest education level: Not on file  Occupational History  . Not on file  Social Needs  . Financial resource strain: Not on file  . Food insecurity:    Worry: Not on file    Inability: Not on file  . Transportation needs:    Medical: Not on file    Non-medical: Not on file  Tobacco Use  . Smoking status: Never Smoker  . Smokeless tobacco: Never Used  Substance and Sexual Activity  . Alcohol use: Yes    Alcohol/week: 3.0 oz    Types: 5 Glasses of wine per week  . Drug use: No  . Sexual activity: Not Currently  Lifestyle  . Physical activity:    Days per week: Not on file    Minutes per session: Not on file  .  Stress: Not on file  Relationships  . Social connections:    Talks on phone: Not on file    Gets together: Not on file    Attends religious service: Not on file    Active member of club or organization: Not on file    Attends meetings of clubs or organizations: Not on file    Relationship status: Not on file  Other Topics Concern  . Not on file  Social History Narrative  . Not on file    Outpatient Encounter Medications as of 11/27/2017  Medication Sig  . amLODipine (NORVASC) 5 MG tablet Take 1 tablet (5 mg total) by mouth 2 (two) times daily.  . Calcium Carbonate-Vitamin D (CALCIUM + D PO) Take by mouth.  . doxycycline (VIBRAMYCIN) 50 MG capsule Take 50 mg by mouth daily.  Marland Kitchen losartan (COZAAR) 100 MG tablet TAKE 1 TABLET BY MOUTH ONCE DAILY  . pravastatin (PRAVACHOL) 10 MG tablet Take 1 tablet (10 mg total) by mouth daily.   Facility-Administered Encounter Medications as  of 11/27/2017  Medication  . 0.9 %  sodium chloride infusion    Review of Systems  Constitutional: Negative for appetite change and unexpected weight change.  HENT: Negative for congestion and sinus pressure.   Respiratory: Negative for cough, chest tightness and shortness of breath.   Cardiovascular: Negative for chest pain, palpitations and leg swelling.       No swelling now.  Previous swelling.   Gastrointestinal: Negative for abdominal pain, diarrhea, nausea and vomiting.  Genitourinary: Negative for difficulty urinating and dysuria.  Musculoskeletal: Negative for joint swelling and myalgias.  Skin: Negative for color change and rash.  Neurological: Negative for dizziness, light-headedness and headaches.  Psychiatric/Behavioral: Negative for agitation and dysphoric mood.       Objective:    Physical Exam  Constitutional: She appears well-developed and well-nourished. No distress.  HENT:  Nose: Nose normal.  Mouth/Throat: Oropharynx is clear and moist.  Neck: Neck supple. No thyromegaly present.    Cardiovascular: Normal rate and regular rhythm.  Pulmonary/Chest: Breath sounds normal. No respiratory distress. She has no wheezes.  Abdominal: Soft. Bowel sounds are normal. There is no tenderness.  Musculoskeletal: She exhibits no edema or tenderness.  Lymphadenopathy:    She has no cervical adenopathy.  Skin: No rash noted. No erythema.  Psychiatric: She has a normal mood and affect. Her behavior is normal.    BP 138/80 (BP Location: Left Arm, Patient Position: Sitting, Cuff Size: Normal)   Pulse 67   Temp 98.3 F (36.8 C) (Oral)   Resp 16   Wt 167 lb 8 oz (76 kg)   SpO2 97%   BMI 26.23 kg/m  Wt Readings from Last 3 Encounters:  11/27/17 167 lb 8 oz (76 kg)  09/12/17 166 lb 3.2 oz (75.4 kg)  06/06/17 168 lb 6.4 oz (76.4 kg)     Lab Results  Component Value Date   WBC 5.6 11/12/2016   HGB 13.7 11/12/2016   HCT 40.8 11/12/2016   PLT 367.0 11/12/2016   GLUCOSE 94 09/10/2017   CHOL 250 (H) 09/10/2017   TRIG 87.0 09/10/2017   HDL 79.80 09/10/2017   LDLCALC 153 (H) 09/10/2017   ALT 11 10/24/2017   AST 12 10/24/2017   NA 139 09/10/2017   K 4.5 09/10/2017   CL 105 09/10/2017   CREATININE 1.14 09/10/2017   BUN 24 (H) 09/10/2017   CO2 26 09/10/2017   TSH 3.90 11/12/2016       Assessment & Plan:   Problem List Items Addressed This Visit    Hypercholesterolemia    On crestor.  Low cholesterol diet and exercise.  Tolerating.  Has not been taking as scheduled recently.  Will restart.  Follow lipid panel and liver function tests.         Relevant Orders   Hepatic function panel   Lipid panel   Hypertension, essential    Blood pressure as outlined.  Have her spot check her pressure.  Same medication regimen.   Follow pressures.  Follow metabolic panel.        Relevant Orders   CBC with Differential/Platelet   TSH   Basic metabolic panel   Renal artery stenosis ([place])    Followed by Dr Bridgett Larsson.  Stable.  Recommended f/u duplex in 12 months after last check.          Other Visit Diagnoses    Edema, unspecified type    -  Primary   Lower extremity edema.  resolved now.  discussed compression  hose.  follow.        Einar Pheasant, MD

## 2017-11-30 ENCOUNTER — Encounter: Payer: Self-pay | Admitting: Internal Medicine

## 2017-11-30 NOTE — Assessment & Plan Note (Signed)
Blood pressure as outlined.  Have her spot check her pressure.  Same medication regimen.   Follow pressures.  Follow metabolic panel.

## 2017-11-30 NOTE — Assessment & Plan Note (Addendum)
On crestor.  Low cholesterol diet and exercise.  Tolerating.  Has not been taking as scheduled recently.  Will restart.  Follow lipid panel and liver function tests.

## 2017-11-30 NOTE — Assessment & Plan Note (Signed)
Followed by Dr Bridgett Larsson.  Stable.  Recommended f/u duplex in 12 months after last check.

## 2017-12-25 NOTE — Progress Notes (Signed)
Established Renal Artery Stenosis    History of Present Illness   Shelby Spencer is a 72 y.o. (May 26, 1946) female who presents with chief complaint: follow up on renal artery stenosis.  Previous renal duplex completed on  reveals: R RA stenosis: <60% and L RA stenosis: <60%  The patient's blood pressure has been stable.  The patient's blood pressure medication regimen has remained stable.  The patient's urinary history has remained stable.  The patient's PMH, PSH, SH, and FamHx were reviewed on 12/31/17 are unchanged from 12/20/16.   Current Outpatient Medications  Medication Sig Dispense Refill  . amLODipine (NORVASC) 5 MG tablet Take 1 tablet (5 mg total) by mouth 2 (two) times daily. 180 tablet 1  . Calcium Carbonate-Vitamin D (CALCIUM + D PO) Take by mouth.    . doxycycline (VIBRAMYCIN) 50 MG capsule Take 50 mg by mouth daily.  6  . losartan (COZAAR) 100 MG tablet TAKE 1 TABLET BY MOUTH ONCE DAILY 90 tablet 1  . pravastatin (PRAVACHOL) 10 MG tablet Take 1 tablet (10 mg total) by mouth daily. 30 tablet 1   Current Facility-Administered Medications  Medication Dose Route Frequency Provider Last Rate Last Dose  . 0.9 %  sodium chloride infusion  500 mL Intravenous Continuous Irene Shipper, MD        No Known Allergies  On ROS today: no change in urinary habits, controlled BP at home   Physical Examination   Vitals:   12/31/17 0922  BP: (!) 141/83  Pulse: 74  Resp: 20  SpO2: 99%  Weight: 165 lb 12.8 oz (75.2 kg)  Height: 5\' 7"  (1.702 m)   Body mass index is 25.97 kg/m.  General Alert, O x 3, WD, NAD  Pulmonary Sym exp, good B air movt, CTA B  Cardiac RRR, Nl S1, S2, no Murmurs, No rubs, No S3,S4  Vascular Vessel Right Left  Radial Palpable Palpable  Brachial Palpable Palpable  Carotid Palpable, No Bruit Palpable, No Bruit  Aorta Not palpable N/A  Femoral Palpable Palpable  Popliteal Not palpable Not palpable  PT Palpable Palpable  DP Palpable Palpable      Gastro- intestinal soft, non-distended, non-tender to palpation, No guarding or rebound, no HSM, no masses, no CVAT B, No palpable prominent aortic pulse,    Musculo- skeletal M/S 5/5 throughout  , Extremities without ischemic changes  , No edema present, No visible varicosities , No Lipodermatosclerosis present  Neurologic Pain and light touch intact in extremities , Motor exam as listed above    Non-Invasive Vascular Imaging   B Renal Duplex (12/31/2017)  R kidney size: 9.6 cm (10.1 cm)  L kidney size: 10.80 cm (9.8 cm)  R RA stenosis: <60% (RAR 2.36)  L RA stenosis: <60% (RAR 1.61)   Medical Decision Making   Shelby Spencer is a 72 y.o. female who presents with: no evidence of hemodynamically significant RAS   Pt has R RAS < 60% on right, but as ASTRAL and CORAL have essentially eliminated most indication for renal artery stenting, I don't see a good reason at this point to continue surveillance.  We discussed returning for re-evaluation if her urinary habits or blood pressure control changes adversely.  I discussed in depth with the patient the nature of atherosclerosis, and emphasized the importance of maximal medical management including strict control of blood pressure, blood glucose, and lipid levels, antiplatelet agents, obtaining regular exercise, and cessation of smoking.    The patient is aware  that without maximal medical management the underlying atherosclerotic disease process will progress, limiting the benefit of any interventions.  The patient is currently on a statin: Pravachol.   The patient is currently on an anti-platelet: ASA.   The patient can follow up with [place] as needed.  Thank you for allowing [place] to participate in this patient's care.   Adele Barthel, MD, FACS [facility] Office: [phone] Pager: [phone]

## 2017-12-26 ENCOUNTER — Encounter (HOSPITAL_COMMUNITY): Payer: Medicare Other

## 2017-12-26 ENCOUNTER — Ambulatory Visit: Payer: Medicare Other | Admitting: Vascular Surgery

## 2017-12-29 ENCOUNTER — Other Ambulatory Visit: Payer: Self-pay | Admitting: Internal Medicine

## 2017-12-31 ENCOUNTER — Encounter: Payer: Self-pay | Admitting: Vascular Surgery

## 2017-12-31 ENCOUNTER — Ambulatory Visit (HOSPITAL_COMMUNITY)
Admission: RE | Admit: 2017-12-31 | Discharge: 2017-12-31 | Disposition: A | Discharge status: Home / Self Care | Payer: Medicare Other | Source: Ambulatory Visit | Attending: Vascular Surgery | Admitting: Vascular Surgery

## 2017-12-31 ENCOUNTER — Ambulatory Visit (INDEPENDENT_AMBULATORY_CARE_PROVIDER_SITE_OTHER): Payer: Medicare Other | Admitting: Vascular Surgery

## 2017-12-31 ENCOUNTER — Other Ambulatory Visit: Payer: Self-pay

## 2017-12-31 VITALS — BP 141/83 | HR 74 | Resp 20 | Ht 67.0 in | Wt 165.8 lb

## 2017-12-31 DIAGNOSIS — I701 Atherosclerosis of renal artery: Secondary | ICD-10-CM

## 2018-03-02 ENCOUNTER — Other Ambulatory Visit (INDEPENDENT_AMBULATORY_CARE_PROVIDER_SITE_OTHER): Payer: Medicare Other

## 2018-03-02 DIAGNOSIS — E7800 Pure hypercholesterolemia, unspecified: Secondary | ICD-10-CM

## 2018-03-02 DIAGNOSIS — I10 Essential (primary) hypertension: Secondary | ICD-10-CM

## 2018-03-02 LAB — HEPATIC FUNCTION PANEL
ALK PHOS: 67 U/L (ref 39–117)
ALT: 11 U/L (ref 0–35)
AST: 12 U/L (ref 0–37)
Albumin: 4.3 g/dL (ref 3.5–5.2)
BILIRUBIN DIRECT: 0.1 mg/dL (ref 0.0–0.3)
BILIRUBIN TOTAL: 0.6 mg/dL (ref 0.2–1.2)
Total Protein: 6.9 g/dL (ref 6.0–8.3)

## 2018-03-02 LAB — CBC WITH DIFFERENTIAL/PLATELET
BASOS PCT: 0.6 % (ref 0.0–3.0)
Basophils Absolute: 0 10*3/uL (ref 0.0–0.1)
EOS ABS: 0.2 10*3/uL (ref 0.0–0.7)
Eosinophils Relative: 4.1 % (ref 0.0–5.0)
HEMATOCRIT: 39.7 % (ref 36.0–46.0)
Hemoglobin: 13.3 g/dL (ref 12.0–15.0)
LYMPHS ABS: 2.1 10*3/uL (ref 0.7–4.0)
LYMPHS PCT: 37 % (ref 12.0–46.0)
MCHC: 33.4 g/dL (ref 30.0–36.0)
MCV: 91.4 fl (ref 78.0–100.0)
Monocytes Absolute: 0.5 10*3/uL (ref 0.1–1.0)
Monocytes Relative: 8.4 % (ref 3.0–12.0)
NEUTROS ABS: 2.9 10*3/uL (ref 1.4–7.7)
NEUTROS PCT: 49.9 % (ref 43.0–77.0)
PLATELETS: 351 10*3/uL (ref 150.0–400.0)
RBC: 4.35 Mil/uL (ref 3.87–5.11)
RDW: 13.9 % (ref 11.5–15.5)
WBC: 5.8 10*3/uL (ref 4.0–10.5)

## 2018-03-02 LAB — TSH: TSH: 5.78 u[IU]/mL — ABNORMAL HIGH (ref 0.35–4.50)

## 2018-03-02 LAB — BASIC METABOLIC PANEL
BUN: 29 mg/dL — AB (ref 6–23)
CHLORIDE: 104 meq/L (ref 96–112)
CO2: 26 mEq/L (ref 19–32)
Calcium: 9.7 mg/dL (ref 8.4–10.5)
Creatinine, Ser: 1.19 mg/dL (ref 0.40–1.20)
GFR: 47.49 mL/min — ABNORMAL LOW (ref >60.00)
GLUCOSE: 103 mg/dL — AB (ref 70–99)
POTASSIUM: 5 meq/L (ref 3.5–5.1)
Sodium: 139 mEq/L (ref 135–145)

## 2018-03-02 LAB — LIPID PANEL
CHOL/HDL RATIO: 3
Cholesterol: 225 mg/dL — ABNORMAL HIGH (ref 0–200)
HDL: 80 mg/dL (ref >39.00)
LDL Cholesterol: 126 mg/dL — ABNORMAL HIGH (ref 0–99)
NONHDL: 144.65
Triglycerides: 95 mg/dL (ref 0.0–149.0)
VLDL: 19 mg/dL (ref 0.0–40.0)

## 2018-03-03 ENCOUNTER — Other Ambulatory Visit: Payer: Self-pay | Admitting: Internal Medicine

## 2018-03-03 ENCOUNTER — Encounter: Payer: Self-pay | Admitting: *Deleted

## 2018-03-03 DIAGNOSIS — R944 Abnormal results of kidney function studies: Secondary | ICD-10-CM

## 2018-03-03 NOTE — Progress Notes (Signed)
Order placed for f/u met b 

## 2018-03-05 ENCOUNTER — Ambulatory Visit: Payer: Medicare Other | Admitting: Internal Medicine

## 2018-03-12 DIAGNOSIS — Z23 Encounter for immunization: Secondary | ICD-10-CM | POA: Diagnosis not present

## 2018-03-24 ENCOUNTER — Other Ambulatory Visit (INDEPENDENT_AMBULATORY_CARE_PROVIDER_SITE_OTHER): Payer: Medicare Other

## 2018-03-24 DIAGNOSIS — R944 Abnormal results of kidney function studies: Secondary | ICD-10-CM

## 2018-03-24 LAB — BASIC METABOLIC PANEL
BUN: 27 mg/dL — ABNORMAL HIGH (ref 6–23)
CHLORIDE: 104 meq/L (ref 96–112)
CO2: 28 mEq/L (ref 19–32)
CREATININE: 1.26 mg/dL — AB (ref 0.40–1.20)
Calcium: 9.5 mg/dL (ref 8.4–10.5)
GFR: 44.46 mL/min — ABNORMAL LOW (ref >60.00)
Glucose, Bld: 88 mg/dL (ref 70–99)
Potassium: 4.9 mEq/L (ref 3.5–5.1)
Sodium: 139 mEq/L (ref 135–145)

## 2018-03-25 ENCOUNTER — Other Ambulatory Visit: Payer: Medicare Other

## 2018-03-27 ENCOUNTER — Other Ambulatory Visit: Payer: Self-pay | Admitting: Internal Medicine

## 2018-03-27 DIAGNOSIS — N183 Chronic kidney disease, stage 3 unspecified: Secondary | ICD-10-CM

## 2018-03-27 DIAGNOSIS — R944 Abnormal results of kidney function studies: Secondary | ICD-10-CM

## 2018-03-27 NOTE — Progress Notes (Signed)
Order placed for nephrology referral.   °

## 2018-05-08 ENCOUNTER — Telehealth: Payer: Self-pay

## 2018-05-08 NOTE — Telephone Encounter (Signed)
Signed and placed in box.   

## 2018-05-08 NOTE — Telephone Encounter (Signed)
Faxed to [facility]

## 2018-05-08 NOTE — Telephone Encounter (Signed)
Order received from [facility] for Bone Density. Pt has been scheduled but signature is needed. Placed in your quick sign.

## 2018-06-02 ENCOUNTER — Ambulatory Visit (INDEPENDENT_AMBULATORY_CARE_PROVIDER_SITE_OTHER): Payer: Medicare Other | Admitting: Internal Medicine

## 2018-06-02 ENCOUNTER — Encounter: Payer: Self-pay | Admitting: Internal Medicine

## 2018-06-02 ENCOUNTER — Telehealth: Payer: Self-pay | Admitting: Internal Medicine

## 2018-06-02 ENCOUNTER — Encounter

## 2018-06-02 ENCOUNTER — Ambulatory Visit (INDEPENDENT_AMBULATORY_CARE_PROVIDER_SITE_OTHER): Payer: Medicare Other

## 2018-06-02 DIAGNOSIS — R05 Cough: Secondary | ICD-10-CM | POA: Diagnosis not present

## 2018-06-02 DIAGNOSIS — R059 Cough, unspecified: Secondary | ICD-10-CM

## 2018-06-02 DIAGNOSIS — I701 Atherosclerosis of renal artery: Secondary | ICD-10-CM | POA: Diagnosis not present

## 2018-06-02 DIAGNOSIS — R918 Other nonspecific abnormal finding of lung field: Secondary | ICD-10-CM | POA: Diagnosis not present

## 2018-06-02 DIAGNOSIS — I10 Essential (primary) hypertension: Secondary | ICD-10-CM

## 2018-06-02 DIAGNOSIS — E7800 Pure hypercholesterolemia, unspecified: Secondary | ICD-10-CM

## 2018-06-02 MED ORDER — PREDNISONE 10 MG PO TABS: ORAL_TABLET | ORAL | 0 refills | Status: DC

## 2018-06-02 NOTE — Progress Notes (Signed)
Patient ID: HAYLEE MCANANY, female   DOB: Jun 19, 1945, 72 y.o.   MRN: [id number]   Subjective:    Patient ID: VESSIE OLMSTED, female    DOB: [birth year], 72 y.o.   MRN: [id number]  HPI  Patient here for a scheduled follow up.  She reports she has been doing relatively well.  Has noticed over the last 5 weeks, persistent cough.  States when first started, increased deep cough and congestion.  Better for a while and then increased again recently.  Increased cough.  Soreness from the cough.  No fever.  No sinus pressure.  No nasal congestion or drainage.  No acid reflux.  No abdominal pain.  Bowels moving.  No urine change.     Past Medical History:  Diagnosis Date  . Cataract   . History of colon polyps   . History of prolapse of bladder   . History of pyloric stenosis   . Hypercholesterolemia   . Hypertension    Past Surgical History:  Procedure Laterality Date  . CATARACT EXTRACTION, BILATERAL    . TONSILLECTOMY    . TUBAL LIGATION     Family History  Problem Relation Age of Onset  . Hypertension Mother   . Heart disease Father   . Colon cancer Neg Hx   . Esophageal cancer Neg Hx   . Pancreatic cancer Neg Hx   . Rectal cancer Neg Hx   . Stomach cancer Neg Hx    Social History   Socioeconomic History  . Marital status: Married    Spouse name: Not on file  . Number of children: Not on file  . Years of education: Not on file  . Highest education level: Not on file  Occupational History  . Not on file  Social Needs  . Financial resource strain: Not on file  . Food insecurity:    Worry: Not on file    Inability: Not on file  . Transportation needs:    Medical: Not on file    Non-medical: Not on file  Tobacco Use  . Smoking status: Never Smoker  . Smokeless tobacco: Never Used  Substance and Sexual Activity  . Alcohol use: Yes    Alcohol/week: 5.0 standard drinks    Types: 5 Glasses of wine per week  . Drug use: No  . Sexual activity: Not Currently  Lifestyle  .  Physical activity:    Days per week: Not on file    Minutes per session: Not on file  . Stress: Not on file  Relationships  . Social connections:    Talks on phone: Not on file    Gets together: Not on file    Attends religious service: Not on file    Active member of club or organization: Not on file    Attends meetings of clubs or organizations: Not on file    Relationship status: Not on file  Other Topics Concern  . Not on file  Social History Narrative  . Not on file    Outpatient Encounter Medications as of 06/02/2018  Medication Sig  . amLODipine (NORVASC) 5 MG tablet Take 1 tablet (5 mg total) by mouth 2 (two) times daily.  . Calcium Carbonate-Vitamin D (CALCIUM + D PO) Take by mouth.  . doxycycline (PERIOSTAT) 20 MG tablet Take 20 mg by mouth daily.   Marland Kitchen losartan (COZAAR) 100 MG tablet TAKE 1 TABLET BY MOUTH ONCE DAILY  . pravastatin (PRAVACHOL) 10 MG tablet TAKE 1 TABLET BY MOUTH  ONCE DAILY. (Patient taking differently: Take 10 mg by mouth. )  . predniSONE (DELTASONE) 10 MG tablet Take 6 tablets x 1 day and then decrease by 1/2 tablet per day until down zero mg.   Facility-Administered Encounter Medications as of 06/02/2018  Medication  . 0.9 %  sodium chloride infusion    Review of Systems  Constitutional: Negative for appetite change, fever and unexpected weight change.  HENT: Negative for congestion and sinus pressure.   Respiratory: Positive for cough. Negative for chest tightness and shortness of breath.   Cardiovascular: Negative for chest pain, palpitations and leg swelling.  Gastrointestinal: Negative for abdominal pain, diarrhea, nausea and vomiting.  Genitourinary: Negative for difficulty urinating and dysuria.  Musculoskeletal: Negative for joint swelling and myalgias.  Skin: Negative for color change and rash.  Neurological: Negative for dizziness, light-headedness and headaches.  Psychiatric/Behavioral: Negative for agitation and dysphoric mood.         Objective:    Physical Exam Constitutional:      General: She is not in acute distress.    Appearance: Normal appearance.  HENT:     Nose: Nose normal. No congestion.     Mouth/Throat:     Pharynx: No oropharyngeal exudate or posterior oropharyngeal erythema.  Neck:     Musculoskeletal: Neck supple. No muscular tenderness.     Thyroid: No thyromegaly.  Cardiovascular:     Rate and Rhythm: Normal rate and regular rhythm.  Pulmonary:     Effort: No respiratory distress.     Breath sounds: Normal breath sounds. No wheezing.     Comments: Increased cough with forced expiration.   Abdominal:     General: Bowel sounds are normal.     Palpations: Abdomen is soft.     Tenderness: There is no abdominal tenderness.  Musculoskeletal:        General: No swelling or tenderness.  Lymphadenopathy:     Cervical: No cervical adenopathy.  Skin:    Findings: No erythema or rash.  Neurological:     Mental Status: She is alert.  Psychiatric:        Mood and Affect: Mood normal.        Behavior: Behavior normal.     BP (!) 146/68   Pulse (!) 48   Temp 98.4 F (36.9 C) (Oral)   Ht 5\' 7"  (1.702 m)   Wt 167 lb 12.8 oz (76.1 kg)   SpO2 96%   BMI 26.28 kg/m  Wt Readings from Last 3 Encounters:  06/02/18 167 lb 12.8 oz (76.1 kg)  12/31/17 165 lb 12.8 oz (75.2 kg)  11/27/17 167 lb 8 oz (76 kg)     Lab Results  Component Value Date   WBC 5.8 03/02/2018   HGB 13.3 03/02/2018   HCT 39.7 03/02/2018   PLT 351.0 03/02/2018   GLUCOSE 88 03/24/2018   CHOL 225 (H) 03/02/2018   TRIG 95.0 03/02/2018   HDL 80.00 03/02/2018   LDLCALC 126 (H) 03/02/2018   ALT 11 03/02/2018   AST 12 03/02/2018   NA 139 03/24/2018   K 4.9 03/24/2018   CL 104 03/24/2018   CREATININE 1.26 (H) 03/24/2018   BUN 27 (H) 03/24/2018   CO2 28 03/24/2018   TSH 5.78 (H) 03/02/2018    Vas US Renal Artery Duplex  Result Date: 12/31/2017 ABDOMINAL VISCERAL Indications: Follow up of right renal artery stenosis.  High Risk Factors: Hyperlipidemia, no history of smoking. Limitations: Air/bowel gas. Performing Technologist: Delorise Shiner RVT  Examination  Guidelines: A complete evaluation includes B-mode imaging, spectral Doppler, color Doppler, and power Doppler as needed of all accessible portions of each vessel. Bilateral testing is considered an integral part of a complete examination. Limited examinations for reoccurring indications may be performed as noted.  Duplex Findings: +------------------+--------+--------+-------+ Right Renal ArteryPSV cm/sEDV cm/sComment +------------------+--------+--------+-------+ Origin              102                   +------------------+--------+--------+-------+ Proximal            112                   +------------------+--------+--------+-------+ Mid                 226                   +------------------+--------+--------+-------+ Distal               52                   +------------------+--------+--------+-------+ +-----------------+--------+--------+-------+ Left Renal ArteryPSV cm/sEDV cm/sComment +-----------------+--------+--------+-------+ Proximal           154                   +-----------------+--------+--------+-------+ Mid                139                   +-----------------+--------+--------+-------+ Distal             131                   +-----------------+--------+--------+-------+ Technologist observations Renal Artery(s):Patent renal veins bilaterally. 1-59% mid right renal artery stenosis (elevated velocities may be secondary to vessel tortuosity). Could not replicate elevated velocity from prior study. +------------+--------+--------+--+-----------+--------+--------+---+ Right KidneyPSV cm/sEDV cm/sRILeft KidneyPSV cm/sEDV cm/sRI  +------------+--------+--------+--+-----------+--------+--------+---+ Upper Pole                    Upper Pole                      +------------+--------+--------+--+-----------+--------+--------+---+ Mid                           Mid                            +------------+--------+--------+--+-----------+--------+--------+---+ Lower Pole                    Lower Pole                     +------------+--------+--------+--+-----------+--------+--------+---+ Hilar                         Hilar                          +------------+--------+--------+--+-----------+--------+--------+---+ +------------------+-------+------------------+-------+ Right Kidney             Left Kidney               +------------------+-------+------------------+-------+ RAR                      RAR                       +------------------+-------+------------------+-------+  RAR (manual)      2.36   RAR (manual)      1.61    +------------------+-------+------------------+-------+ Cortex                   Cortex                    +------------------+-------+------------------+-------+ Cortex thickness  1.49 mmCorex thickness   0.97 mm +------------------+-------+------------------+-------+ Kidney length (cm)9.60   Kidney length (cm)10.80   +------------------+-------+------------------+-------+  FINAL INTERPRETATION: Renal:  Right: Normal size right kidney. 1-59% stenosis of the right renal        artery. Left:  Normal size of left kidney. No evidence of left renal artery        stenosis. *See table(s) above for measurements and observations.  Diagnosing physician: Adele Barthel MD  Electronically signed by Adele Barthel MD on 12/31/2017 at 10:07:19 AM.    Final        Assessment & Plan:   Problem List Items Addressed This Visit    Cough    Persistent cough as outlined.  Question if viral respiratory infection.  No fever.  Increased cough with forced expiration.  Coughing fits.  Will check cxr given persistence.  Also treat with prednisone taper as directed.  Robitussin/mucinex as directed.  Hold abx.  Follow.         Relevant Orders   DG Chest 2 View (Completed)   Hypercholesterolemia    On crestor.  Low cholesterol diet and exercise.  Follow lipid panel and liver function tests.        Hypertension, essential    Blood pressure on recheck improved.  States has been controlled at home.  Same medication regimen.  Follow pressures.  Follow metabolic panel.        Renal artery stenosis ([facility])    Has been followed by Dr Bridgett Larsson.  Just evaluated.  Stable.            Einar Pheasant, MD

## 2018-06-02 NOTE — Progress Notes (Signed)
Pre visit review using our clinic review tool, if applicable. No additional management support is needed unless otherwise documented below in the visit note. 

## 2018-06-02 NOTE — Patient Instructions (Signed)
mucinex in the am and robitussin DM in the pm.

## 2018-06-03 ENCOUNTER — Encounter: Payer: Self-pay | Admitting: Internal Medicine

## 2018-06-03 NOTE — Assessment & Plan Note (Signed)
Persistent cough as outlined.  Question if viral respiratory infection.  No fever.  Increased cough with forced expiration.  Coughing fits.  Will check cxr given persistence.  Also treat with prednisone taper as directed.  Robitussin/mucinex as directed.  Hold abx.  Follow.

## 2018-06-03 NOTE — Assessment & Plan Note (Signed)
On crestor.  Low cholesterol diet and exercise.  Follow lipid panel and liver function tests.   

## 2018-06-03 NOTE — Assessment & Plan Note (Signed)
Has been followed by Dr Bridgett Larsson.  Just evaluated.  Stable.

## 2018-06-03 NOTE — Assessment & Plan Note (Signed)
Blood pressure on recheck improved.  States has been controlled at home.  Same medication regimen.  Follow pressures.  Follow metabolic panel.

## 2018-06-04 ENCOUNTER — Encounter: Payer: Self-pay | Admitting: Internal Medicine

## 2018-06-05 ENCOUNTER — Other Ambulatory Visit: Payer: Self-pay

## 2018-06-05 ENCOUNTER — Emergency Department
Admission: EM | Admit: 2018-06-05 | Discharge: 2018-06-05 | Disposition: A | Discharge status: Home / Self Care | Payer: Medicare Other | Attending: Student in an Organized Health Care Education/Training Program | Admitting: Student in an Organized Health Care Education/Training Program

## 2018-06-05 ENCOUNTER — Ambulatory Visit: Payer: Self-pay | Admitting: *Deleted

## 2018-06-05 ENCOUNTER — Encounter: Payer: Self-pay | Admitting: Emergency Medicine

## 2018-06-05 DIAGNOSIS — Z79899 Other long term (current) drug therapy: Secondary | ICD-10-CM | POA: Insufficient documentation

## 2018-06-05 DIAGNOSIS — R5383 Other fatigue: Secondary | ICD-10-CM | POA: Diagnosis not present

## 2018-06-05 DIAGNOSIS — R42 Dizziness and giddiness: Secondary | ICD-10-CM | POA: Diagnosis not present

## 2018-06-05 DIAGNOSIS — I10 Essential (primary) hypertension: Secondary | ICD-10-CM | POA: Insufficient documentation

## 2018-06-05 DIAGNOSIS — T402X1A Poisoning by other opioids, accidental (unintentional), initial encounter: Secondary | ICD-10-CM | POA: Diagnosis not present

## 2018-06-05 DIAGNOSIS — T40601A Poisoning by unspecified narcotics, accidental (unintentional), initial encounter: Secondary | ICD-10-CM | POA: Insufficient documentation

## 2018-06-05 DIAGNOSIS — Z7902 Long term (current) use of antithrombotics/antiplatelets: Secondary | ICD-10-CM | POA: Diagnosis not present

## 2018-06-05 LAB — ACETAMINOPHEN LEVEL: Acetaminophen (Tylenol), Serum: <10 ug/mL — ABNORMAL LOW (ref 10–30)

## 2018-06-05 NOTE — Discharge Instructions (Addendum)
Follow up with PCP

## 2018-06-05 NOTE — ED Notes (Signed)
Pt verbalized understanding of discharge instructions. NAD at this time. 

## 2018-06-05 NOTE — ED Notes (Signed)
Pt states the percocets she took today was ingested around 1230-1300. Pt A&O x 4 at this time. Sitting on stretcher talking on phone.

## 2018-06-05 NOTE — ED Notes (Signed)
Pt took pills at 1230PM (give or take an hour, per pt).  Per Dr. Burlene Arnt, tylenol level now and at 4 hour mark.

## 2018-06-05 NOTE — Telephone Encounter (Signed)
Agree with need for evaluation.  Pt in ER now.

## 2018-06-05 NOTE — ED Triage Notes (Signed)
Pt via pov from home after ingesting 5 of her husband's percocet earlier today. She was prescribed prednisone and it was sitting next to the percocet and she took the wrong one. Pt states she began to feel lightheaded and was able to figure it out. Pt alert & oriented during triage.

## 2018-06-05 NOTE — ED Provider Notes (Signed)
[facility] Emergency Department Provider Note    First MD Initiated Contact with Patient 06/05/18 1536     (approximate)  I have reviewed the triage vital signs and the nursing notes.   HISTORY  Chief Complaint Ingestion    HPI Shelby Spencer is a 73 y.o. female Shelby Spencer to the ER for evaluation of accidental ingestion of oxycodone.  She is currently prescribed prednisone and her husband is status post orthopedic surgery so they have 2 bottles of prednisone and oxycodone.  Patient states that she was getting back from running errands around noon and grabbed a bottle and ingested 5 pills that she is due for 58 mg of prednisone.  Roughly 30 to 45 minutes later she started feeling lightheaded and tired.  States that she was having foggy thoughts.  At that point she realized that she had accidentally ingested the oxycodone instead of her prednisone.  Presents the ER for further evaluation.  This was an accidental ingestion.  Denies any SI or HI.    Past Medical History:  Diagnosis Date  . Cataract   . History of colon polyps   . History of prolapse of bladder   . History of pyloric stenosis   . Hypercholesterolemia   . Hypertension    Family History  Problem Relation Age of Onset  . Hypertension Mother   . Heart disease Father   . Colon cancer Neg Hx   . Esophageal cancer Neg Hx   . Pancreatic cancer Neg Hx   . Rectal cancer Neg Hx   . Stomach cancer Neg Hx    Past Surgical History:  Procedure Laterality Date  . CATARACT EXTRACTION, BILATERAL    . TONSILLECTOMY    . TUBAL LIGATION     Patient Active Problem List   Diagnosis Date Noted  . Cough 06/02/2018  . Renal artery stenosis ([place]) 12/13/2015  . Abdominal bruit 10/09/2015  . History of colonic polyps 10/30/2014  . Female bladder prolapse 10/30/2014  . Hypercholesterolemia 10/30/2014  . Health care maintenance 10/30/2014  . Hypertension, essential 10/30/2014      Prior to Admission  medications   Medication Sig Start Date End Date Taking? Authorizing Provider  amLODipine (NORVASC) 5 MG tablet TAKE 1 TABLET BY MOUTH ONCE DAILY 06/04/18   Einar Pheasant, MD  Calcium Carbonate-Vitamin D (CALCIUM + D PO) Take by mouth.    [provider]  doxycycline (PERIOSTAT) 20 MG tablet Take 20 mg by mouth daily.  08/25/14   [provider]  losartan (COZAAR) 100 MG tablet TAKE 1 TABLET BY MOUTH ONCE DAILY 10/24/17   Einar Pheasant, MD  pravastatin (PRAVACHOL) 10 MG tablet TAKE 1 TABLET BY MOUTH ONCE DAILY. Patient taking differently: Take 10 mg by mouth.  12/29/17   Einar Pheasant, MD  predniSONE (DELTASONE) 10 MG tablet Take 6 tablets x 1 day and then decrease by 1/2 tablet per day until down zero mg. 06/02/18   Einar Pheasant, MD    Allergies Patient has no known allergies.    Social History Social History   Tobacco Use  . Smoking status: Never Smoker  . Smokeless tobacco: Never Used  Substance Use Topics  . Alcohol use: Yes    Alcohol/week: 5.0 standard drinks    Types: 5 Glasses of wine per week    Comment: per week  . Drug use: No    Review of Systems Patient denies headaches, rhinorrhea, blurry vision, numbness, shortness of breath, chest pain, edema, cough,  abdominal pain, nausea, vomiting, diarrhea, dysuria, fevers, rashes or hallucinations unless otherwise stated above in HPI. ____________________________________________   PHYSICAL EXAM:  VITAL SIGNS: Vitals:   06/05/18 1513  BP: (!) 143/62  Pulse: 62  Resp: 18  Temp: 97.6 F (36.4 C)  SpO2: 100%    Constitutional: Alert and oriented.  Eyes: Conjunctivae are normal. Perrla 12mm and reactive Head: Atraumatic. Nose: No congestion/rhinnorhea. Mouth/Throat: Mucous membranes are moist.   Neck: No stridor. Painless ROM.  Cardiovascular: Normal rate, regular rhythm. Grossly normal heart sounds.  Good peripheral circulation. Respiratory: Normal respiratory effort.  No retractions. Lungs  CTAB. Gastrointestinal: Soft and nontender. No distention. No abdominal bruits. No CVA tenderness. Genitourinary:  Musculoskeletal: No lower extremity tenderness nor edema.  No joint effusions. Neurologic:  Normal speech and language. No gross focal neurologic deficits are appreciated. No facial droop Skin:  Skin is warm, dry and intact. No rash noted. Psychiatric: Mood and affect are normal. Speech and behavior are normal.  ____________________________________________   LABS (all labs ordered are listed, but only abnormal results are displayed)  Results for orders placed or performed during the hospital encounter of 06/05/18 (from the past 24 hour(s))  Acetaminophen level     Status: Abnormal   Collection Time: 06/05/18  3:34 PM  Result Value Ref Range   Acetaminophen (Tylenol), Serum <10 (L) 10 - 30 ug/mL   ____________________________________________ _________________________________  RADIOLOGY   ____________________________________________   PROCEDURES  Procedure(s) performed:  Procedures    Critical Care performed: no ____________________________________________   INITIAL IMPRESSION / ASSESSMENT AND PLAN / ED COURSE  Pertinent labs & imaging results that were available during my care of the patient were reviewed by me and considered in my medical decision making (see chart for details).   DDX: accidental overdose, toxic ingestion, respiratory depression  Shelby Spencer is a 73 y.o. who presents to the ED with sx as described above.  Patient is AFVSS in ED. Exam as above. Given current presentation have considered the above differential.  No respiratory depression.  No report or evidence of tylenol ingestion.  Will observe in ER until 6p as that would be 6 hours post ingestion.  Clinical Course as of Jun 05 1698  Fri Jun 05, 2018  1653 Patient rechecked.  Nontoxic.  Certainly no worsening of her symptoms.  Will observe until 6:00 which would be the completion of  her 6-hour duration of action of ingested medication.  She has a safe ride home.  If remains stable after 6:00 will be appropriate for discharge home.   [PR]    Clinical Course User Index [PR] Merlyn Lot, MD     As part of my medical decision making, I reviewed the following data within the [place] notes reviewed and incorporated, Labs reviewed, notes from prior ED visits.   ____________________________________________   FINAL CLINICAL IMPRESSION(S) / ED DIAGNOSES  Final diagnoses:  Overdose of opiate or related narcotic, accidental or unintentional, initial encounter ([place])      NEW MEDICATIONS STARTED DURING THIS VISIT:  New Prescriptions   No medications on file     Note:  This document was prepared using Dragon voice recognition software and may include unintentional dictation errors.    Merlyn Lot, MD 06/05/18 1700

## 2018-06-05 NOTE — Telephone Encounter (Signed)
Patient took the wrong pills out of the wrong bottles- patient thought she was taking 5 prednisone pills and instead she took 5 oxycodone 5 mg. (Patient's husband just had knee surgery) Call to PCP- advised patient she needs to go to ED. Patient will have neighbor drive her.  Reason for Disposition . All OTHER POTENTIALLY HARMFUL SUBSTANCES (e.g., nearly all chemicals, plants, more than a double dose of a drug, took someone else's medicine)  Answer Assessment - Initial Assessment Questions 1. SUBSTANCE: "What was swallowed?" If necessary, have the caller look at the label on the container.      Too many pain pills oxycodone 5 mg 2. AMOUNT: "How much was swallowed?" (Err on the side of recording the maximal amount that is missing)      5 tablets 3. ONSET: "When was it probably swallowed?" (Minutes or hours ago)      1:00 4. SYMPTOMS: "Do you have any symptoms?" If so, ask: "What are they?" (e.g., abdominal pain, vomiting, weakness)      Dizzy, light headed- not bad 5. SUICIDAL: "Did you take this to hurt or kill yourself?"     No accidental  6. PREGNANCY: "Is there any chance you are pregnant?" "When was your last menstrual period?"     n/a  Protocols used: POISONING-A-AH

## 2018-06-08 NOTE — Telephone Encounter (Signed)
Reviewed my chart message.  I would like for her to continue using the incentive spirometer.  If cough persists, I would like for her to see pulmonary.  Has been going on for a while despite medication.  If agreeable, let me know and I will place order for referral.

## 2018-06-08 NOTE — Telephone Encounter (Signed)
Pt states that Dr Nicki Reaper increased her amLODipine (NORVASC) 5 MG tablet and per insurance they are unaware of this and so therefore they are not approving for her to get her refill until January 24th. Patient would like [employer] to be contacted to update this and change the prescription with [facility] so she can pick up the medication. Please Advise

## 2018-06-08 NOTE — Telephone Encounter (Signed)
LMTCB

## 2018-06-09 ENCOUNTER — Other Ambulatory Visit: Payer: Self-pay

## 2018-06-09 ENCOUNTER — Other Ambulatory Visit: Payer: Self-pay | Admitting: Internal Medicine

## 2018-06-09 ENCOUNTER — Telehealth: Payer: Self-pay

## 2018-06-09 DIAGNOSIS — I10 Essential (primary) hypertension: Secondary | ICD-10-CM

## 2018-06-09 MED ORDER — AMLODIPINE BESYLATE 5 MG PO TABS: 5.0000 mg | ORAL_TABLET | Freq: Two times a day (BID) | ORAL | 1 refills | Status: DC

## 2018-06-09 NOTE — Telephone Encounter (Signed)
Per Judson Roch, pt will continue to use the spirometer but declines pulmonary referral at this time.

## 2018-06-09 NOTE — Telephone Encounter (Signed)
If she has been taking 5mg  bid and prefers to stay on 5mg  bid, then ok to refill.  If wants to take 10mg  q day, then ok to change.

## 2018-06-09 NOTE — Telephone Encounter (Signed)
Patient confirmed she wanted to continue 5 mg BID. I have sent rx in

## 2018-06-09 NOTE — Telephone Encounter (Signed)
LMTCB

## 2018-06-09 NOTE — Telephone Encounter (Signed)
Duplicate message. Already sent to provider

## 2018-06-09 NOTE — Telephone Encounter (Signed)
Copied from [place] [id number]. Topic: General - Other >> Jun 08, 2018  5:33 PM Yvette Rack wrote: Reason for CRM: Pt returned call to office. Pt requests call back. Cb# [phone] cell# or home# [phone]

## 2018-06-11 DIAGNOSIS — Z1231 Encounter for screening mammogram for malignant neoplasm of breast: Secondary | ICD-10-CM | POA: Diagnosis not present

## 2018-06-11 DIAGNOSIS — Z8262 Family history of osteoporosis: Secondary | ICD-10-CM | POA: Diagnosis not present

## 2018-06-11 DIAGNOSIS — M8589 Other specified disorders of bone density and structure, multiple sites: Secondary | ICD-10-CM | POA: Diagnosis not present

## 2018-06-11 LAB — HM MAMMOGRAPHY

## 2018-06-11 LAB — HM DEXA SCAN

## 2018-06-17 DIAGNOSIS — I701 Atherosclerosis of renal artery: Secondary | ICD-10-CM | POA: Diagnosis not present

## 2018-06-17 DIAGNOSIS — N183 Chronic kidney disease, stage 3 (moderate): Secondary | ICD-10-CM | POA: Diagnosis not present

## 2018-06-17 DIAGNOSIS — I10 Essential (primary) hypertension: Secondary | ICD-10-CM | POA: Diagnosis not present

## 2018-06-25 DIAGNOSIS — D2262 Melanocytic nevi of left upper limb, including shoulder: Secondary | ICD-10-CM | POA: Diagnosis not present

## 2018-06-25 DIAGNOSIS — D225 Melanocytic nevi of trunk: Secondary | ICD-10-CM | POA: Diagnosis not present

## 2018-06-25 DIAGNOSIS — D2272 Melanocytic nevi of left lower limb, including hip: Secondary | ICD-10-CM | POA: Diagnosis not present

## 2018-06-25 DIAGNOSIS — D2271 Melanocytic nevi of right lower limb, including hip: Secondary | ICD-10-CM | POA: Diagnosis not present

## 2018-06-25 DIAGNOSIS — D2261 Melanocytic nevi of right upper limb, including shoulder: Secondary | ICD-10-CM | POA: Diagnosis not present

## 2018-07-31 DIAGNOSIS — N183 Chronic kidney disease, stage 3 (moderate): Secondary | ICD-10-CM | POA: Diagnosis not present

## 2018-07-31 DIAGNOSIS — I701 Atherosclerosis of renal artery: Secondary | ICD-10-CM | POA: Diagnosis not present

## 2018-07-31 DIAGNOSIS — I129 Hypertensive chronic kidney disease with stage 1 through stage 4 chronic kidney disease, or unspecified chronic kidney disease: Secondary | ICD-10-CM | POA: Diagnosis not present

## 2018-08-13 ENCOUNTER — Other Ambulatory Visit: Payer: Self-pay | Admitting: Internal Medicine

## 2018-10-02 ENCOUNTER — Ambulatory Visit (INDEPENDENT_AMBULATORY_CARE_PROVIDER_SITE_OTHER): Payer: Medicare Other | Admitting: Internal Medicine

## 2018-10-02 ENCOUNTER — Other Ambulatory Visit: Payer: Self-pay

## 2018-10-02 DIAGNOSIS — I701 Atherosclerosis of renal artery: Secondary | ICD-10-CM

## 2018-10-02 DIAGNOSIS — E7800 Pure hypercholesterolemia, unspecified: Secondary | ICD-10-CM

## 2018-10-02 DIAGNOSIS — I10 Essential (primary) hypertension: Secondary | ICD-10-CM

## 2018-10-02 NOTE — Progress Notes (Signed)
Patient ID: Shelby Spencer, female   DOB: [birth year], 73 y.o.   MRN: [id number] Virtual Visit via Video Note  This visit type was conducted due to national recommendations for restrictions regarding the COVID-19 pandemic (e.g. social distancing).  This format is felt to be most appropriate for this patient at this time.  All issues noted in this document were discussed and addressed.  No physical exam was performed (except for noted visual exam findings with Video Visits).   I connected with Shelby Spencer by a video enabled telemedicine application  and verified that I am speaking with the correct person using two identifiers. Location patient: home Location provider: work  Persons participating in the virtual visit: patient, provider  I discussed the limitations, risks, security and privacy concerns of performing an evaluation and management service by video and the availability of in person appointments. The patient expressed understanding and agreed to proceed.   Reason for visit: scheduled follow up.   HPI: She is doing well.  States she feels good.  Stays active.  No chest pain.  No sob.  No acid reflux.  No abdominal pain.  Bowels moving.  Off pravastatin.  Intolerance.  Wants to remain off statin medication.  Had problems with crestor and pravastatin.  Discussed other treatment options.  Discussed zetia.  Hold for now starting a new medication.  Blood pressures have been averaging 121-131/70s.  Trying to stay in.  No known COVID exposure.  No fever.  No cough, chest congestion or sob.  Overall feels good.     ROS: See pertinent positives and negatives per HPI.  Past Medical History:  Diagnosis Date  . Cataract   . History of colon polyps   . History of prolapse of bladder   . History of pyloric stenosis   . Hypercholesterolemia   . Hypertension     Past Surgical History:  Procedure Laterality Date  . CATARACT EXTRACTION, BILATERAL    . TONSILLECTOMY    . TUBAL LIGATION       Family History  Problem Relation Age of Onset  . Hypertension Mother   . Heart disease Father   . Colon cancer Neg Hx   . Esophageal cancer Neg Hx   . Pancreatic cancer Neg Hx   . Rectal cancer Neg Hx   . Stomach cancer Neg Hx     SOCIAL HX: reviewed.    Current Outpatient Medications:  .  amLODipine (NORVASC) 5 MG tablet, Take 1 tablet (5 mg total) by mouth 2 (two) times daily., Disp: 180 tablet, Rfl: 1 .  Calcium Carbonate-Vitamin D (CALCIUM + D PO), Take by mouth., Disp: , Rfl:  .  doxycycline (PERIOSTAT) 20 MG tablet, Take 20 mg by mouth daily. , Disp: , Rfl: 6 .  losartan (COZAAR) 100 MG tablet, TAKE 1 TABLET BY MOUTH ONCE DAILY, Disp: 90 tablet, Rfl: 1  Current Facility-Administered Medications:  .  0.9 %  sodium chloride infusion, 500 mL, Intravenous, Continuous, Irene Shipper, MD  EXAM:  VITALS per patient if applicable:  476/54   GENERAL: alert, oriented, appears well and in no acute distress  HEENT: atraumatic, conjunttiva clear, no obvious abnormalities on inspection of external nose and ears  NECK: normal movements of the head and neck  LUNGS: on inspection no signs of respiratory distress, breathing rate appears normal, no obvious gross SOB, gasping or wheezing  CV: no obvious cyanosis  PSYCH/NEURO: pleasant and cooperative, no obvious depression or anxiety, speech and thought processing grossly  intact  ASSESSMENT AND PLAN:  Discussed the following assessment and plan:  Hypercholesterolemia - Plan: Lipid panel, Hepatic function panel  Hypertension, essential - Plan: TSH, Basic metabolic panel  Renal artery stenosis (HCC)  Hypercholesterolemia Intolerant to crestor and pravastatin.  Low cholesterol diet and exercise.  Follow lipid panel.  Discussed other treatment options.  Discussed zetia.  Hold on any further testing at this time.  Follow.    Hypertension, essential Blood pressure as outlined.  Doing well.  Continue current medication regimen.   Follow pressures.  Follow metabolic panel.    Renal artery stenosis [facility]) Has been evaluated by Dr Bridgett Larsson.  Stable.     I discussed the assessment and treatment plan with the patient. The patient was provided an opportunity to ask questions and all were answered. The patient agreed with the plan and demonstrated an understanding of the instructions.   The patient was advised to call back or seek an in-person evaluation if the symptoms worsen or if the condition fails to improve as anticipated.    Einar Pheasant, MD

## 2018-10-04 ENCOUNTER — Encounter: Payer: Self-pay | Admitting: Internal Medicine

## 2018-10-04 NOTE — Assessment & Plan Note (Signed)
Blood pressure as outlined.  Doing well.  Continue current medication regimen.  Follow pressures.  Follow metabolic panel.

## 2018-10-04 NOTE — Assessment & Plan Note (Signed)
Has been evaluated by Dr Bridgett Larsson.  Stable.

## 2018-10-04 NOTE — Assessment & Plan Note (Signed)
Intolerant to crestor and pravastatin.  Low cholesterol diet and exercise.  Follow lipid panel.  Discussed other treatment options.  Discussed zetia.  Hold on any further testing at this time.  Follow.

## 2018-11-06 DIAGNOSIS — N183 Chronic kidney disease, stage 3 (moderate): Secondary | ICD-10-CM | POA: Diagnosis not present

## 2018-11-06 DIAGNOSIS — I129 Hypertensive chronic kidney disease with stage 1 through stage 4 chronic kidney disease, or unspecified chronic kidney disease: Secondary | ICD-10-CM | POA: Diagnosis not present

## 2018-11-06 DIAGNOSIS — I701 Atherosclerosis of renal artery: Secondary | ICD-10-CM | POA: Diagnosis not present

## 2018-11-12 ENCOUNTER — Other Ambulatory Visit: Payer: Self-pay | Admitting: Internal Medicine

## 2018-12-07 ENCOUNTER — Other Ambulatory Visit: Payer: Self-pay

## 2018-12-07 ENCOUNTER — Other Ambulatory Visit (INDEPENDENT_AMBULATORY_CARE_PROVIDER_SITE_OTHER): Payer: Medicare Other

## 2018-12-07 DIAGNOSIS — E7800 Pure hypercholesterolemia, unspecified: Secondary | ICD-10-CM

## 2018-12-07 DIAGNOSIS — I10 Essential (primary) hypertension: Secondary | ICD-10-CM

## 2018-12-07 LAB — BASIC METABOLIC PANEL
BUN: 29 mg/dL — ABNORMAL HIGH (ref 6–23)
CO2: 25 mEq/L (ref 19–32)
Calcium: 9.5 mg/dL (ref 8.4–10.5)
Chloride: 104 mEq/L (ref 96–112)
Creatinine, Ser: 1.16 mg/dL (ref 0.40–1.20)
GFR: 45.92 mL/min — ABNORMAL LOW (ref >60.00)
Glucose, Bld: 96 mg/dL (ref 70–99)
Potassium: 4.5 mEq/L (ref 3.5–5.1)
Sodium: 138 mEq/L (ref 135–145)

## 2018-12-07 LAB — HEPATIC FUNCTION PANEL
ALT: 11 U/L (ref 0–35)
AST: 10 U/L (ref 0–37)
Albumin: 4.4 g/dL (ref 3.5–5.2)
Alkaline Phosphatase: 81 U/L (ref 39–117)
Bilirubin, Direct: 0.1 mg/dL (ref 0.0–0.3)
Total Bilirubin: 0.8 mg/dL (ref 0.2–1.2)
Total Protein: 6.7 g/dL (ref 6.0–8.3)

## 2018-12-07 LAB — LIPID PANEL
Cholesterol: 281 mg/dL — ABNORMAL HIGH (ref 0–200)
HDL: 78.8 mg/dL (ref >39.00)
LDL Cholesterol: 181 mg/dL — ABNORMAL HIGH (ref 0–99)
NonHDL: 201.81
Total CHOL/HDL Ratio: 4
Triglycerides: 104 mg/dL (ref 0.0–149.0)
VLDL: 20.8 mg/dL (ref 0.0–40.0)

## 2018-12-07 LAB — TSH: TSH: 4.56 u[IU]/mL — ABNORMAL HIGH (ref 0.35–4.50)

## 2018-12-09 ENCOUNTER — Other Ambulatory Visit: Payer: Self-pay

## 2018-12-09 MED ORDER — EZETIMIBE 10 MG PO TABS: 10.0000 mg | ORAL_TABLET | Freq: Every day | ORAL | 3 refills | Status: DC

## 2018-12-16 ENCOUNTER — Other Ambulatory Visit: Payer: Self-pay | Admitting: Internal Medicine

## 2019-02-04 ENCOUNTER — Other Ambulatory Visit: Payer: Self-pay | Admitting: Internal Medicine

## 2019-02-10 ENCOUNTER — Ambulatory Visit (INDEPENDENT_AMBULATORY_CARE_PROVIDER_SITE_OTHER): Payer: Medicare Other | Admitting: Internal Medicine

## 2019-02-10 ENCOUNTER — Other Ambulatory Visit: Payer: Self-pay

## 2019-02-10 ENCOUNTER — Encounter: Payer: Self-pay | Admitting: Internal Medicine

## 2019-02-10 VITALS — BP 138/78 | HR 70 | Temp 95.2°F | Resp 16 | Ht 66.0 in | Wt 166.2 lb

## 2019-02-10 DIAGNOSIS — Z23 Encounter for immunization: Secondary | ICD-10-CM | POA: Diagnosis not present

## 2019-02-10 DIAGNOSIS — I10 Essential (primary) hypertension: Secondary | ICD-10-CM | POA: Diagnosis not present

## 2019-02-10 DIAGNOSIS — I701 Atherosclerosis of renal artery: Secondary | ICD-10-CM | POA: Diagnosis not present

## 2019-02-10 DIAGNOSIS — R7989 Other specified abnormal findings of blood chemistry: Secondary | ICD-10-CM | POA: Diagnosis not present

## 2019-02-10 DIAGNOSIS — E7800 Pure hypercholesterolemia, unspecified: Secondary | ICD-10-CM

## 2019-02-10 LAB — TSH: TSH: 3.76 u[IU]/mL (ref 0.35–4.50)

## 2019-02-10 NOTE — Progress Notes (Signed)
Patient ID: Shelby Spencer, female   DOB: Feb 07, 1946, 73 y.o.   MRN: [id number]   Subjective:    Patient ID: Shelby Spencer, female    DOB: 13-Jan-1946, 73 y.o.   MRN: [id number]  HPI  Patient here for a scheduled follow up. States she is doing well.  Trying to stay active.  No chest pain.  No sob.  No acid reflux. No abdominal pain.  Bowels moving.  Discussed cholesterol.  She has been intolerant to pravastatin and crestor.  Currently taking zetia.  Tolerating.  Discussed her last labs.  Slightly elevated tsh.  Will recheck today.  Handling stress.  Had questions about covid.  Answered.  Overall feels good.     Past Medical History:  Diagnosis Date  . Cataract   . History of colon polyps   . History of prolapse of bladder   . History of pyloric stenosis   . Hypercholesterolemia   . Hypertension    Past Surgical History:  Procedure Laterality Date  . CATARACT EXTRACTION, BILATERAL    . TONSILLECTOMY    . TUBAL LIGATION     Family History  Problem Relation Age of Onset  . Hypertension Mother   . Heart disease Father   . Colon cancer Neg Hx   . Esophageal cancer Neg Hx   . Pancreatic cancer Neg Hx   . Rectal cancer Neg Hx   . Stomach cancer Neg Hx    Social History   Socioeconomic History  . Marital status: Married    Spouse name: Not on file  . Number of children: Not on file  . Years of education: Not on file  . Highest education level: Not on file  Occupational History  . Not on file  Social Needs  . Financial resource strain: Not on file  . Food insecurity    Worry: Not on file    Inability: Not on file  . Transportation needs    Medical: Not on file    Non-medical: Not on file  Tobacco Use  . Smoking status: Never Smoker  . Smokeless tobacco: Never Used  Substance and Sexual Activity  . Alcohol use: Yes    Alcohol/week: 5.0 standard drinks    Types: 5 Glasses of wine per week    Comment: per week  . Drug use: No  . Sexual activity: Not Currently  Lifestyle   . Physical activity    Days per week: Not on file    Minutes per session: Not on file  . Stress: Not on file  Relationships  . Social Herbalist on phone: Not on file    Gets together: Not on file    Attends religious service: Not on file    Active member of club or organization: Not on file    Attends meetings of clubs or organizations: Not on file    Relationship status: Not on file  Other Topics Concern  . Not on file  Social History Narrative  . Not on file    Outpatient Encounter Medications as of 02/10/2019  Medication Sig  . amLODipine (NORVASC) 5 MG tablet TAKE 1 TABLET BY MOUTH TWICE DAILY  . Calcium Carbonate-Vitamin D (CALCIUM + D PO) Take by mouth.  . doxycycline (PERIOSTAT) 20 MG tablet Take 20 mg by mouth 2 (two) times daily.   Marland Kitchen ezetimibe (ZETIA) 10 MG tablet Take 1 tablet (10 mg total) by mouth daily.  Marland Kitchen losartan (COZAAR) 100 MG tablet TAKE 1  TABLET BY MOUTH ONCE DAILY   Facility-Administered Encounter Medications as of 02/10/2019  Medication  . 0.9 %  sodium chloride infusion    Review of Systems  Constitutional: Negative for appetite change and unexpected weight change.  HENT: Negative for congestion and sinus pressure.   Respiratory: Negative for cough, chest tightness and shortness of breath.   Cardiovascular: Negative for chest pain, palpitations and leg swelling.  Gastrointestinal: Negative for abdominal pain, diarrhea, nausea and vomiting.  Genitourinary: Negative for difficulty urinating and dysuria.  Musculoskeletal: Negative for joint swelling and myalgias.  Skin: Negative for color change and rash.  Neurological: Negative for dizziness, light-headedness and headaches.  Psychiatric/Behavioral: Negative for agitation and dysphoric mood.       Objective:    Physical Exam Constitutional:      General: She is not in acute distress.    Appearance: Normal appearance.  HENT:     Right Ear: External ear normal.     Left Ear: External ear  normal.  Eyes:     General: No scleral icterus.       Right eye: No discharge.        Left eye: No discharge.     Conjunctiva/sclera: Conjunctivae normal.  Neck:     Musculoskeletal: Neck supple. No muscular tenderness.     Thyroid: No thyromegaly.  Cardiovascular:     Rate and Rhythm: Normal rate and regular rhythm.  Pulmonary:     Effort: No respiratory distress.     Breath sounds: Normal breath sounds. No wheezing.  Abdominal:     General: Bowel sounds are normal.     Palpations: Abdomen is soft.     Tenderness: There is no abdominal tenderness.  Musculoskeletal:        General: No swelling or tenderness.  Lymphadenopathy:     Cervical: No cervical adenopathy.  Skin:    Findings: No erythema or rash.  Neurological:     Mental Status: She is alert.  Psychiatric:        Mood and Affect: Mood normal.        Behavior: Behavior normal.     BP 138/78   Pulse 70   Temp (!) 95.2 F (35.1 C)   Resp 16   Ht 5\' 6"  (1.676 m)   Wt 166 lb 3.2 oz (75.4 kg)   SpO2 98%   BMI 26.83 kg/m  Wt Readings from Last 3 Encounters:  02/10/19 166 lb 3.2 oz (75.4 kg)  06/05/18 164 lb (74.4 kg)  06/02/18 167 lb 12.8 oz (76.1 kg)     Lab Results  Component Value Date   WBC 5.8 03/02/2018   HGB 13.3 03/02/2018   HCT 39.7 03/02/2018   PLT 351.0 03/02/2018   GLUCOSE 96 12/07/2018   CHOL 281 (H) 12/07/2018   TRIG 104.0 12/07/2018   HDL 78.80 12/07/2018   LDLCALC 181 (H) 12/07/2018   ALT 11 12/07/2018   AST 10 12/07/2018   NA 138 12/07/2018   K 4.5 12/07/2018   CL 104 12/07/2018   CREATININE 1.16 12/07/2018   BUN 29 (H) 12/07/2018   CO2 25 12/07/2018   TSH 3.76 02/10/2019       Assessment & Plan:   Problem List Items Addressed This Visit    Hypercholesterolemia    Intolerant to crestor and pravastatin.  Recently started zetia and is tolerating.  Follow lipid panel.        Relevant Orders   Hepatic function panel   Lipid panel  Hypertension, essential    Blood  pressure overall under reasonable control.  Continue current medication.  Follow pressures.  Have her spot check her pressure.  Follow metabolic panel.        Relevant Orders   TSH (Completed)   CBC with Differential/Platelet   Basic metabolic panel   Renal artery stenosis [facility])    Has been evaluated by Dr Bridgett Larsson.  Stable.           Other Visit Diagnoses    Elevated TSH    -  Primary   Need for immunization against influenza       Relevant Orders   Flu Vaccine QUAD High Dose(Fluad) (Completed)       Einar Pheasant, MD

## 2019-02-14 ENCOUNTER — Encounter: Payer: Self-pay | Admitting: Internal Medicine

## 2019-02-14 NOTE — Assessment & Plan Note (Addendum)
Has been evaluated by Dr Chen.  Stable.  

## 2019-02-14 NOTE — Assessment & Plan Note (Signed)
Blood pressure overall under reasonable control.  Continue current medication.  Follow pressures.  Have her spot check her pressure.  Follow metabolic panel.

## 2019-02-14 NOTE — Assessment & Plan Note (Signed)
Intolerant to crestor and pravastatin.  Recently started zetia and is tolerating.  Follow lipid panel.

## 2019-06-17 DIAGNOSIS — Z1231 Encounter for screening mammogram for malignant neoplasm of breast: Secondary | ICD-10-CM | POA: Diagnosis not present

## 2019-06-17 LAB — HM MAMMOGRAPHY

## 2019-06-22 DIAGNOSIS — R8290 Unspecified abnormal findings in urine: Secondary | ICD-10-CM | POA: Diagnosis not present

## 2019-06-22 DIAGNOSIS — I129 Hypertensive chronic kidney disease with stage 1 through stage 4 chronic kidney disease, or unspecified chronic kidney disease: Secondary | ICD-10-CM | POA: Diagnosis not present

## 2019-06-22 DIAGNOSIS — N1831 Chronic kidney disease, stage 3a: Secondary | ICD-10-CM | POA: Diagnosis not present

## 2019-06-22 DIAGNOSIS — I701 Atherosclerosis of renal artery: Secondary | ICD-10-CM | POA: Diagnosis not present

## 2019-06-25 DIAGNOSIS — R8290 Unspecified abnormal findings in urine: Secondary | ICD-10-CM | POA: Diagnosis not present

## 2019-06-25 DIAGNOSIS — I701 Atherosclerosis of renal artery: Secondary | ICD-10-CM | POA: Diagnosis not present

## 2019-06-25 DIAGNOSIS — N1831 Chronic kidney disease, stage 3a: Secondary | ICD-10-CM | POA: Diagnosis not present

## 2019-06-26 ENCOUNTER — Ambulatory Visit: Payer: Medicare Other | Attending: Internal Medicine

## 2019-06-26 DIAGNOSIS — Z23 Encounter for immunization: Secondary | ICD-10-CM

## 2019-06-26 NOTE — Progress Notes (Signed)
   Covid-19 Vaccination Clinic  Name:  Shelby Spencer    MRN: [id number] DOB: 03/28/1946  06/26/2019  [name] was observed post Covid-19 immunization for 15 minutes without incidence. She was provided with Vaccine Information Sheet and instruction to access the V-Safe system.   [name] was instructed to call 911 with any severe reactions post vaccine: Marland Kitchen Difficulty breathing  . Swelling of your face and throat  . A fast heartbeat  . A bad rash all over your body  . Dizziness and weakness    Immunizations Administered    Name Date Dose VIS Date Route   Pfizer COVID-19 Vaccine 06/26/2019  3:06 PM 0.3 mL 05/14/2019 Intramuscular   Manufacturer: [place]   Lot: [id number]   [place]: [id number]

## 2019-07-16 ENCOUNTER — Other Ambulatory Visit: Payer: Self-pay | Admitting: Internal Medicine

## 2019-07-17 ENCOUNTER — Ambulatory Visit: Payer: Medicare Other | Attending: Internal Medicine

## 2019-07-17 DIAGNOSIS — Z23 Encounter for immunization: Secondary | ICD-10-CM

## 2019-07-17 NOTE — Progress Notes (Signed)
   Covid-19 Vaccination Clinic  Name:  Shelby Spencer    MRN: [id number] DOB: 1945-12-07  07/17/2019  [name] was observed post Covid-19 immunization for 15 minutes without incidence. She was provided with Vaccine Information Sheet and instruction to access the V-Safe system.   [name] was instructed to call 911 with any severe reactions post vaccine: Marland Kitchen Difficulty breathing  . Swelling of your face and throat  . A fast heartbeat  . A bad rash all over your body  . Dizziness and weakness    Immunizations Administered    Name Date Dose VIS Date Route   Pfizer COVID-19 Vaccine 07/17/2019 10:35 AM 0.3 mL 05/14/2019 Intramuscular   Manufacturer: [place]   Lot: [id number]   [place]: [id number]

## 2019-08-11 ENCOUNTER — Encounter: Payer: Self-pay | Admitting: Internal Medicine

## 2019-09-22 ENCOUNTER — Other Ambulatory Visit: Payer: Self-pay | Admitting: Internal Medicine

## 2019-12-21 ENCOUNTER — Other Ambulatory Visit (INDEPENDENT_AMBULATORY_CARE_PROVIDER_SITE_OTHER): Payer: Medicare Other

## 2019-12-21 ENCOUNTER — Other Ambulatory Visit: Payer: Self-pay

## 2019-12-21 DIAGNOSIS — E7800 Pure hypercholesterolemia, unspecified: Secondary | ICD-10-CM | POA: Diagnosis not present

## 2019-12-21 DIAGNOSIS — I10 Essential (primary) hypertension: Secondary | ICD-10-CM | POA: Diagnosis not present

## 2019-12-21 LAB — BASIC METABOLIC PANEL
BUN: 23 mg/dL (ref 6–23)
CO2: 26 mEq/L (ref 19–32)
Calcium: 9.5 mg/dL (ref 8.4–10.5)
Chloride: 105 mEq/L (ref 96–112)
Creatinine, Ser: 1.16 mg/dL (ref 0.40–1.20)
GFR: 45.79 mL/min — ABNORMAL LOW (ref >60.00)
Glucose, Bld: 98 mg/dL (ref 70–99)
Potassium: 4.4 mEq/L (ref 3.5–5.1)
Sodium: 139 mEq/L (ref 135–145)

## 2019-12-21 LAB — CBC WITH DIFFERENTIAL/PLATELET
Basophils Absolute: 0.1 10*3/uL (ref 0.0–0.1)
Basophils Relative: 0.7 % (ref 0.0–3.0)
Eosinophils Absolute: 0.2 10*3/uL (ref 0.0–0.7)
Eosinophils Relative: 2.5 % (ref 0.0–5.0)
HCT: 39.5 % (ref 36.0–46.0)
Hemoglobin: 13.4 g/dL (ref 12.0–15.0)
Lymphocytes Relative: 23.7 % (ref 12.0–46.0)
Lymphs Abs: 2.3 10*3/uL (ref 0.7–4.0)
MCHC: 33.8 g/dL (ref 30.0–36.0)
MCV: 91.3 fl (ref 78.0–100.0)
Monocytes Absolute: 0.8 10*3/uL (ref 0.1–1.0)
Monocytes Relative: 7.9 % (ref 3.0–12.0)
Neutro Abs: 6.4 10*3/uL (ref 1.4–7.7)
Neutrophils Relative %: 65.2 % (ref 43.0–77.0)
Platelets: 331 10*3/uL (ref 150.0–400.0)
RBC: 4.33 Mil/uL (ref 3.87–5.11)
RDW: 13.5 % (ref 11.5–15.5)
WBC: 9.7 10*3/uL (ref 4.0–10.5)

## 2019-12-21 LAB — LIPID PANEL
Cholesterol: 221 mg/dL — ABNORMAL HIGH (ref 0–200)
HDL: 72.9 mg/dL (ref >39.00)
LDL Cholesterol: 125 mg/dL — ABNORMAL HIGH (ref 0–99)
NonHDL: 148.49
Total CHOL/HDL Ratio: 3
Triglycerides: 116 mg/dL (ref 0.0–149.0)
VLDL: 23.2 mg/dL (ref 0.0–40.0)

## 2019-12-21 LAB — HEPATIC FUNCTION PANEL
ALT: 12 U/L (ref 0–35)
AST: 11 U/L (ref 0–37)
Albumin: 4.3 g/dL (ref 3.5–5.2)
Alkaline Phosphatase: 74 U/L (ref 39–117)
Bilirubin, Direct: 0.1 mg/dL (ref 0.0–0.3)
Total Bilirubin: 0.6 mg/dL (ref 0.2–1.2)
Total Protein: 6.4 g/dL (ref 6.0–8.3)

## 2019-12-23 ENCOUNTER — Other Ambulatory Visit: Payer: Self-pay

## 2019-12-23 ENCOUNTER — Ambulatory Visit (INDEPENDENT_AMBULATORY_CARE_PROVIDER_SITE_OTHER): Payer: Medicare Other | Admitting: Internal Medicine

## 2019-12-23 DIAGNOSIS — E7800 Pure hypercholesterolemia, unspecified: Secondary | ICD-10-CM

## 2019-12-23 DIAGNOSIS — I701 Atherosclerosis of renal artery: Secondary | ICD-10-CM | POA: Diagnosis not present

## 2019-12-23 DIAGNOSIS — I10 Essential (primary) hypertension: Secondary | ICD-10-CM | POA: Diagnosis not present

## 2019-12-23 NOTE — Progress Notes (Signed)
Patient ID: Shelby Spencer, female   DOB: 06-12-1945, 74 y.o.   MRN: [id number]   Subjective:    Patient ID: Shelby Spencer, female    DOB: [birth year], 74 y.o.   MRN: [id number]  HPI This visit occurred during the SARS-CoV-2 public health emergency.  Safety protocols were in place, including screening questions prior to the visit, additional usage of staff PPE, and extensive cleaning of exam room while observing appropriate contact time as indicated for disinfecting solutions.  Patient here for a scheduled follow up.  She reports she is doing well.  Feels good.  Staying active.  No chest pain or sob reported.  No abdominal pain. Bowels moving.  No urine change reported.  Saw nephrology 06/2019.  Discussed labs.    Past Medical History:  Diagnosis Date   Cataract    History of colon polyps    History of prolapse of bladder    History of pyloric stenosis    Hypercholesterolemia    Hypertension    Past Surgical History:  Procedure Laterality Date   CATARACT EXTRACTION, BILATERAL     TONSILLECTOMY     TUBAL LIGATION     Family History  Problem Relation Age of Onset   Hypertension Mother    Heart disease Father    Colon cancer Neg Hx    Esophageal cancer Neg Hx    Pancreatic cancer Neg Hx    Rectal cancer Neg Hx    Stomach cancer Neg Hx    Social History   Socioeconomic History   Marital status: Married    Spouse name: Not on file   Number of children: Not on file   Years of education: Not on file   Highest education level: Not on file  Occupational History   Not on file  Tobacco Use   Smoking status: Never Smoker   Smokeless tobacco: Never Used  Vaping Use   Vaping Use: Never used  Substance and Sexual Activity   Alcohol use: Yes    Alcohol/week: 5.0 standard drinks    Types: 5 Glasses of wine per week    Comment: per week   Drug use: No   Sexual activity: Not Currently  Other Topics Concern   Not on file  Social History Narrative   Not  on file   Social Determinants of Health   Financial Resource Strain:    Difficulty of Paying Living Expenses:   Food Insecurity:    Worried About Charity fundraiser in the Last Year:    Arboriculturist in the Last Year:   Transportation Needs:    Film/video editor (Medical):    Lack of Transportation (Non-Medical):   Physical Activity:    Days of Exercise per Week:    Minutes of Exercise per Session:   Stress:    Feeling of Stress :   Social Connections:    Frequency of Communication with Friends and Family:    Frequency of Social Gatherings with Friends and Family:    Attends Religious Services:    Active Member of Clubs or Organizations:    Attends Archivist Meetings:    Marital Status:     Outpatient Encounter Medications as of 12/23/2019  Medication Sig   amLODipine (NORVASC) 5 MG tablet TAKE 1 TABLET BY MOUTH TWICE DAILY   Calcium Carbonate-Vitamin D (CALCIUM + D PO) Take by mouth.   doxycycline (PERIOSTAT) 20 MG tablet Take 20 mg by mouth 2 (two) times daily.  ezetimibe (ZETIA) 10 MG tablet TAKE 1 TABLET BY MOUTH ONCE DAILY   losartan (COZAAR) 100 MG tablet TAKE 1 TABLET BY MOUTH ONCE DAILY   Facility-Administered Encounter Medications as of 12/23/2019  Medication   0.9 %  sodium chloride infusion    Review of Systems  Constitutional: Negative for appetite change and unexpected weight change.  HENT: Negative for congestion and sinus pressure.   Respiratory: Negative for cough, chest tightness and shortness of breath.   Cardiovascular: Negative for chest pain, palpitations and leg swelling.  Gastrointestinal: Negative for abdominal pain, diarrhea, nausea and vomiting.  Genitourinary: Negative for difficulty urinating and dysuria.  Musculoskeletal: Negative for joint swelling and myalgias.  Skin: Negative for color change and rash.  Neurological: Negative for dizziness, light-headedness and headaches.  Psychiatric/Behavioral:  Negative for agitation and dysphoric mood.       Objective:    Physical Exam Vitals reviewed.  Constitutional:      General: She is not in acute distress.    Appearance: Normal appearance.  HENT:     Head: Normocephalic and atraumatic.     Right Ear: External ear normal.     Left Ear: External ear normal.  Eyes:     General: No scleral icterus.       Right eye: No discharge.        Left eye: No discharge.     Conjunctiva/sclera: Conjunctivae normal.  Neck:     Thyroid: No thyromegaly.  Cardiovascular:     Rate and Rhythm: Normal rate and regular rhythm.  Pulmonary:     Effort: No respiratory distress.     Breath sounds: Normal breath sounds. No wheezing.  Abdominal:     General: Bowel sounds are normal.     Palpations: Abdomen is soft.     Tenderness: There is no abdominal tenderness.  Musculoskeletal:        General: No swelling or tenderness.     Cervical back: Neck supple. No tenderness.  Lymphadenopathy:     Cervical: No cervical adenopathy.  Skin:    Findings: No erythema or rash.  Neurological:     Mental Status: She is alert.  Psychiatric:        Mood and Affect: Mood normal.        Behavior: Behavior normal.     BP 128/78    Pulse 75    Temp 98.2 F (36.8 C)    Resp 16    Ht 5\' 8"  (1.727 m)    Wt 165 lb 6.4 oz (75 kg)    SpO2 98%    BMI 25.15 kg/m  Wt Readings from Last 3 Encounters:  12/23/19 165 lb 6.4 oz (75 kg)  02/10/19 166 lb 3.2 oz (75.4 kg)  06/05/18 164 lb (74.4 kg)     Lab Results  Component Value Date   WBC 9.7 12/21/2019   HGB 13.4 12/21/2019   HCT 39.5 12/21/2019   PLT 331.0 12/21/2019   GLUCOSE 98 12/21/2019   CHOL 221 (H) 12/21/2019   TRIG 116.0 12/21/2019   HDL 72.90 12/21/2019   LDLCALC 125 (H) 12/21/2019   ALT 12 12/21/2019   AST 11 12/21/2019   NA 139 12/21/2019   K 4.4 12/21/2019   CL 105 12/21/2019   CREATININE 1.16 12/21/2019   BUN 23 12/21/2019   CO2 26 12/21/2019   TSH 3.76 02/10/2019       Assessment &  Plan:   Problem List Items Addressed This Visit    Hypercholesterolemia  On zetia.  Intolerant to crestor and pravastatin.  Tolerating zetia.  Follow lipid panel.  Low cholesterol diet and exercise.        Relevant Orders   Hepatic function panel   Lipid panel   Hypertension, essential    Blood pressure as outlined.  Continue amlodipine and losartan.  Follow pressures.  Follow metabolic panel.        Relevant Orders   TSH   Basic metabolic panel   Renal artery stenosis [facility])    Has been evaluated by Dr Bridgett Larsson.  Stable.  Blood pressure doing well.  Follow renal function.            Einar Pheasant, MD

## 2020-01-02 ENCOUNTER — Encounter: Payer: Self-pay | Admitting: Internal Medicine

## 2020-01-02 NOTE — Assessment & Plan Note (Signed)
On zetia.  Intolerant to crestor and pravastatin.  Tolerating zetia.  Follow lipid panel.  Low cholesterol diet and exercise.

## 2020-01-02 NOTE — Assessment & Plan Note (Signed)
Has been evaluated by Dr Bridgett Larsson.  Stable.  Blood pressure doing well.  Follow renal function.

## 2020-01-02 NOTE — Assessment & Plan Note (Signed)
Blood pressure as outlined. Continue amlodipine and losartan.  Follow pressures.  Follow metabolic panel.  

## 2020-02-01 DIAGNOSIS — L03031 Cellulitis of right toe: Secondary | ICD-10-CM | POA: Diagnosis not present

## 2020-02-14 DIAGNOSIS — Z23 Encounter for immunization: Secondary | ICD-10-CM | POA: Diagnosis not present

## 2020-02-15 DIAGNOSIS — L03031 Cellulitis of right toe: Secondary | ICD-10-CM | POA: Diagnosis not present

## 2020-02-21 ENCOUNTER — Other Ambulatory Visit: Payer: Self-pay | Admitting: Internal Medicine

## 2020-02-21 ENCOUNTER — Ambulatory Visit: Payer: Medicare Other | Attending: Internal Medicine

## 2020-02-21 DIAGNOSIS — Z23 Encounter for immunization: Secondary | ICD-10-CM

## 2020-02-21 NOTE — Progress Notes (Signed)
   Covid-19 Vaccination Clinic  Name:  Shelby Spencer    MRN: [id number] DOB: 1946-05-16  02/21/2020  [name] was observed post Covid-19 immunization for 15 minutes without incident. She was provided with Vaccine Information Sheet and instruction to access the V-Safe system.   [name] was instructed to call 911 with any severe reactions post vaccine: Marland Kitchen Difficulty breathing  . Swelling of face and throat  . A fast heartbeat  . A bad rash all over body  . Dizziness and weakness

## 2020-03-09 ENCOUNTER — Encounter: Payer: Self-pay | Admitting: Internal Medicine

## 2020-06-22 DIAGNOSIS — Z1231 Encounter for screening mammogram for malignant neoplasm of breast: Secondary | ICD-10-CM | POA: Diagnosis not present

## 2020-06-22 LAB — HM MAMMOGRAPHY

## 2020-06-27 ENCOUNTER — Other Ambulatory Visit: Payer: Self-pay

## 2020-06-27 ENCOUNTER — Other Ambulatory Visit (INDEPENDENT_AMBULATORY_CARE_PROVIDER_SITE_OTHER): Payer: Medicare Other

## 2020-06-27 DIAGNOSIS — E7800 Pure hypercholesterolemia, unspecified: Secondary | ICD-10-CM | POA: Diagnosis not present

## 2020-06-27 DIAGNOSIS — I10 Essential (primary) hypertension: Secondary | ICD-10-CM

## 2020-06-27 LAB — LIPID PANEL
Cholesterol: 242 mg/dL — ABNORMAL HIGH (ref 0–200)
HDL: 78.2 mg/dL (ref >39.00)
LDL Cholesterol: 136 mg/dL — ABNORMAL HIGH (ref 0–99)
NonHDL: 163.79
Total CHOL/HDL Ratio: 3
Triglycerides: 141 mg/dL (ref 0.0–149.0)
VLDL: 28.2 mg/dL (ref 0.0–40.0)

## 2020-06-27 LAB — BASIC METABOLIC PANEL
BUN: 20 mg/dL (ref 6–23)
CO2: 28 mEq/L (ref 19–32)
Calcium: 9.7 mg/dL (ref 8.4–10.5)
Chloride: 105 mEq/L (ref 96–112)
Creatinine, Ser: 1.11 mg/dL (ref 0.40–1.20)
GFR: 49.3 mL/min — ABNORMAL LOW (ref >60.00)
Glucose, Bld: 101 mg/dL — ABNORMAL HIGH (ref 70–99)
Potassium: 4.3 mEq/L (ref 3.5–5.1)
Sodium: 139 mEq/L (ref 135–145)

## 2020-06-27 LAB — HEPATIC FUNCTION PANEL
ALT: 10 U/L (ref 0–35)
AST: 10 U/L (ref 0–37)
Albumin: 4.5 g/dL (ref 3.5–5.2)
Alkaline Phosphatase: 75 U/L (ref 39–117)
Bilirubin, Direct: 0.1 mg/dL (ref 0.0–0.3)
Total Bilirubin: 0.7 mg/dL (ref 0.2–1.2)
Total Protein: 6.8 g/dL (ref 6.0–8.3)

## 2020-06-27 LAB — TSH: TSH: 8.85 u[IU]/mL — ABNORMAL HIGH (ref 0.35–4.50)

## 2020-06-28 ENCOUNTER — Other Ambulatory Visit: Payer: Self-pay | Admitting: Internal Medicine

## 2020-06-28 DIAGNOSIS — D2261 Melanocytic nevi of right upper limb, including shoulder: Secondary | ICD-10-CM | POA: Diagnosis not present

## 2020-06-28 DIAGNOSIS — D2271 Melanocytic nevi of right lower limb, including hip: Secondary | ICD-10-CM | POA: Diagnosis not present

## 2020-06-28 DIAGNOSIS — D225 Melanocytic nevi of trunk: Secondary | ICD-10-CM | POA: Diagnosis not present

## 2020-06-28 DIAGNOSIS — D2262 Melanocytic nevi of left upper limb, including shoulder: Secondary | ICD-10-CM | POA: Diagnosis not present

## 2020-06-28 DIAGNOSIS — D2272 Melanocytic nevi of left lower limb, including hip: Secondary | ICD-10-CM | POA: Diagnosis not present

## 2020-06-29 ENCOUNTER — Encounter: Payer: Self-pay | Admitting: Internal Medicine

## 2020-06-29 ENCOUNTER — Ambulatory Visit (INDEPENDENT_AMBULATORY_CARE_PROVIDER_SITE_OTHER): Payer: Medicare Other | Admitting: Internal Medicine

## 2020-06-29 ENCOUNTER — Other Ambulatory Visit: Payer: Self-pay

## 2020-06-29 VITALS — BP 136/90 | HR 55 | Temp 97.5°F | Resp 16 | Ht 68.0 in | Wt 163.2 lb

## 2020-06-29 DIAGNOSIS — E7800 Pure hypercholesterolemia, unspecified: Secondary | ICD-10-CM | POA: Diagnosis not present

## 2020-06-29 DIAGNOSIS — E039 Hypothyroidism, unspecified: Secondary | ICD-10-CM | POA: Diagnosis not present

## 2020-06-29 DIAGNOSIS — R000 Tachycardia, unspecified: Secondary | ICD-10-CM | POA: Diagnosis not present

## 2020-06-29 DIAGNOSIS — I701 Atherosclerosis of renal artery: Secondary | ICD-10-CM | POA: Diagnosis not present

## 2020-06-29 DIAGNOSIS — I10 Essential (primary) hypertension: Secondary | ICD-10-CM

## 2020-06-29 NOTE — Progress Notes (Signed)
Patient ID: Shelby Spencer, female   DOB: Nov 13, 1945, 75 y.o.   MRN: [id number]   Subjective:    Patient ID: Shelby Spencer, female    DOB: August 27, 1945, 75 y.o.   MRN: [id number]  HPI This visit occurred during the SARS-CoV-2 public health emergency.  Safety protocols were in place, including screening questions prior to the visit, additional usage of staff PPE, and extensive cleaning of exam room while observing appropriate contact time as indicated for disinfecting solutions.  Patient here for a scheduled follow up.  Here to follow up regarding her blood pressure and cholesterol.  Reports that a few weeks ago, noticed increased heart rate - woke her from sleep.  Has another episode since that initial.  Notices occasional heart pounding.  She stays active.  Has been on 3 (2 mile) hikes in the last 4 weeks.  No chest pain or sob reported during these hikes.  Otherwise feels well.  Discussed diet and exercise.  No acid reflux or abdominal pain reported.  No bowel change reported. Blood pressure up some recently.    Past Medical History:  Diagnosis Date  . Cataract   . History of colon polyps   . History of prolapse of bladder   . History of pyloric stenosis   . Hypercholesterolemia   . Hypertension    Past Surgical History:  Procedure Laterality Date  . CATARACT EXTRACTION, BILATERAL    . TONSILLECTOMY    . TUBAL LIGATION     Family History  Problem Relation Age of Onset  . Hypertension Mother   . Heart disease Father   . Colon cancer Neg Hx   . Esophageal cancer Neg Hx   . Pancreatic cancer Neg Hx   . Rectal cancer Neg Hx   . Stomach cancer Neg Hx    Social History   Socioeconomic History  . Marital status: Married    Spouse name: Not on file  . Number of children: Not on file  . Years of education: Not on file  . Highest education level: Not on file  Occupational History  . Not on file  Tobacco Use  . Smoking status: Never Smoker  . Smokeless tobacco: Never Used  Vaping  Use  . Vaping Use: Never used  Substance and Sexual Activity  . Alcohol use: Yes    Alcohol/week: 5.0 standard drinks    Types: 5 Glasses of wine per week    Comment: per week  . Drug use: No  . Sexual activity: Not Currently  Other Topics Concern  . Not on file  Social History Narrative  . Not on file   Social Determinants of Health   Financial Resource Strain: Not on file  Food Insecurity: Not on file  Transportation Needs: Not on file  Physical Activity: Not on file  Stress: Not on file  Social Connections: Not on file    Outpatient Encounter Medications as of 06/29/2020  Medication Sig  . levothyroxine (SYNTHROID) 50 MCG tablet Take 1 tablet (50 mcg total) by mouth daily before breakfast.  . amLODipine (NORVASC) 5 MG tablet TAKE 1 TABLET BY MOUTH TWICE DAILY  . Calcium Carbonate-Vitamin D (CALCIUM + D PO) Take by mouth.  . doxycycline (PERIOSTAT) 20 MG tablet Take 20 mg by mouth 2 (two) times daily.   Marland Kitchen ezetimibe (ZETIA) 10 MG tablet TAKE 1 TABLET BY MOUTH ONCE DAILY  . losartan (COZAAR) 100 MG tablet TAKE 1 TABLET BY MOUTH ONCE DAILY   Facility-Administered Encounter Medications  as of 06/29/2020  Medication  . 0.9 %  sodium chloride infusion    Review of Systems  Constitutional: Negative for appetite change and unexpected weight change.  HENT: Negative for congestion and sinus pressure.   Respiratory: Negative for cough, chest tightness and shortness of breath.   Cardiovascular: Negative for chest pain and leg swelling.       Episodes of increased heart racing as outlined.  Also noticed pounding of heart.    Gastrointestinal: Negative for abdominal pain, diarrhea, nausea and vomiting.  Genitourinary: Negative for difficulty urinating and dysuria.  Musculoskeletal: Negative for joint swelling and myalgias.  Skin: Negative for color change and rash.  Neurological: Negative for dizziness, light-headedness and headaches.  Psychiatric/Behavioral: Negative for agitation  and dysphoric mood.       Objective:    Physical Exam Vitals reviewed.  Constitutional:      General: She is not in acute distress.    Appearance: Normal appearance.  HENT:     Head: Normocephalic and atraumatic.     Right Ear: External ear normal.     Left Ear: External ear normal.     Mouth/Throat:     Mouth: Oropharynx is clear and moist.  Eyes:     General: No scleral icterus.       Right eye: No discharge.        Left eye: No discharge.     Conjunctiva/sclera: Conjunctivae normal.  Neck:     Thyroid: No thyromegaly.  Cardiovascular:     Rate and Rhythm: Normal rate and regular rhythm.  Pulmonary:     Effort: No respiratory distress.     Breath sounds: Normal breath sounds. No wheezing.  Abdominal:     General: Bowel sounds are normal.     Palpations: Abdomen is soft.     Tenderness: There is no abdominal tenderness.  Musculoskeletal:        General: No swelling, tenderness or edema.     Cervical back: Neck supple. No tenderness.  Lymphadenopathy:     Cervical: No cervical adenopathy.  Skin:    Findings: No erythema or rash.  Neurological:     Mental Status: She is alert.  Psychiatric:        Mood and Affect: Mood normal.        Behavior: Behavior normal.     BP 136/90   Pulse (!) 55   Temp (!) 97.5 F (36.4 C) (Oral)   Resp 16   Ht 5\' 8"  (1.727 m)   Wt 163 lb 3.2 oz (74 kg)   SpO2 99%   BMI 24.81 kg/m  Wt Readings from Last 3 Encounters:  06/29/20 163 lb 3.2 oz (74 kg)  12/23/19 165 lb 6.4 oz (75 kg)  02/10/19 166 lb 3.2 oz (75.4 kg)     Lab Results  Component Value Date   WBC 9.7 12/21/2019   HGB 13.4 12/21/2019   HCT 39.5 12/21/2019   PLT 331.0 12/21/2019   GLUCOSE 101 (H) 06/27/2020   CHOL 242 (H) 06/27/2020   TRIG 141.0 06/27/2020   HDL 78.20 06/27/2020   LDLCALC 136 (H) 06/27/2020   ALT 10 06/27/2020   AST 10 06/27/2020   NA 139 06/27/2020   K 4.3 06/27/2020   CL 105 06/27/2020   CREATININE 1.11 06/27/2020   BUN 20 06/27/2020    CO2 28 06/27/2020   TSH 8.85 (H) 06/27/2020       Assessment & Plan:   Problem List Items Addressed This  Visit    Hypercholesterolemia    On zetia.  Intolerant to statin medication.  Low cholesterol diet and exercise.  Follow lipid panel.       Hypertension, essential    On losartan and amlodipine.  Blood pressure slightly elevated recently.  Discussed medication change.  Will have her spot check and send in readings.  Pursue cardiac w/up as outlined.  Further adjustments in medication pending w/up and if persistent elevation.       Hypothyroid    TSH elevated.  Start synthroid.  Recheck TSH in 6-8 weeks.        Relevant Medications   levothyroxine (SYNTHROID) 50 MCG tablet   Racing heart beat - Primary    Described the episodes of increased heart rate and heart pounding as outlined. EKG - SR with no acute ischemic changes.  Is very active.  No chest pain or sob with hiking.  Discussed further evaluation, including event monitor, echo, etc.  She is agreeable.  Refer to cardiology.       Relevant Orders   EKG 12-Lead (Completed)   Ambulatory referral to Cardiology   Renal artery stenosis ([place])    Evaluated by [facility] (Dr Bridgett Larsson).  Follow blood pressure and renal function.           Einar Pheasant, MD

## 2020-06-30 DIAGNOSIS — E039 Hypothyroidism, unspecified: Secondary | ICD-10-CM | POA: Insufficient documentation

## 2020-06-30 MED ORDER — LEVOTHYROXINE SODIUM 50 MCG PO TABS: 50.0000 ug | ORAL_TABLET | Freq: Every day | ORAL | 2 refills | Status: DC

## 2020-07-02 ENCOUNTER — Encounter: Payer: Self-pay | Admitting: Internal Medicine

## 2020-07-02 DIAGNOSIS — R000 Tachycardia, unspecified: Secondary | ICD-10-CM | POA: Insufficient documentation

## 2020-07-02 NOTE — Assessment & Plan Note (Signed)
On zetia.  Intolerant to statin medication.  Low cholesterol diet and exercise.  Follow lipid panel.   

## 2020-07-02 NOTE — Assessment & Plan Note (Signed)
Described the episodes of increased heart rate and heart pounding as outlined. EKG - SR with no acute ischemic changes.  Is very active.  No chest pain or sob with hiking.  Discussed further evaluation, including event monitor, echo, etc.  She is agreeable.  Refer to cardiology.

## 2020-07-02 NOTE — Assessment & Plan Note (Signed)
On losartan and amlodipine.  Blood pressure slightly elevated recently.  Discussed medication change.  Will have her spot check and send in readings.  Pursue cardiac w/up as outlined.  Further adjustments in medication pending w/up and if persistent elevation.

## 2020-07-02 NOTE — Assessment & Plan Note (Signed)
TSH elevated.  Start synthroid.  Recheck TSH in 6-8 weeks.

## 2020-07-02 NOTE — Assessment & Plan Note (Signed)
Evaluated by [facility] (Dr Bridgett Larsson).  Follow blood pressure and renal function.

## 2020-07-12 ENCOUNTER — Ambulatory Visit (INDEPENDENT_AMBULATORY_CARE_PROVIDER_SITE_OTHER): Payer: Medicare Other | Admitting: Cardiology

## 2020-07-12 ENCOUNTER — Encounter: Payer: Self-pay | Admitting: Cardiology

## 2020-07-12 ENCOUNTER — Ambulatory Visit (INDEPENDENT_AMBULATORY_CARE_PROVIDER_SITE_OTHER): Payer: Medicare Other

## 2020-07-12 ENCOUNTER — Other Ambulatory Visit: Payer: Self-pay

## 2020-07-12 VITALS — BP 148/84 | HR 83 | Ht 68.0 in | Wt 164.1 lb

## 2020-07-12 DIAGNOSIS — R000 Tachycardia, unspecified: Secondary | ICD-10-CM | POA: Diagnosis not present

## 2020-07-12 NOTE — Progress Notes (Signed)
Electrophysiology Office Note:    Date:  07/12/2020   ID:  Shelby Spencer, DOB [birth year], MRN [id number]  PCP:  Einar Pheasant, MD  [facility] Cardiologist:  No primary care provider on file.  [facility] Electrophysiologist:  Vickie Epley, MD   Referring MD: Einar Pheasant, MD   Chief Complaint: Palpitations  History of Present Illness:    Shelby Spencer is a 75 y.o. female who presents for an evaluation of palpitations at the request of Dr. Nicki Reaper. Their medical history includes CKD 3, hypercholesterolemia, hypertension.  She tells me that she recently awoke from sleep and melanite with palpitations.  She said her heart was beating very quickly.  It lasted for a couple of minutes and then resolved by itself.  She has not had any additional episodes of this arrhythmia.  She does report being told the past she snored but she does not think this is a common occurrence.  She is never had an abnormal heart rhythm other than this 1 episode.  No syncope or presyncope.  She is very active.  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  She and her husband hike frequently to go bird watching.  Past Medical History:  Diagnosis Date  . Cataract   . CKD (chronic kidney disease), stage III ([place])   . History of colon polyps   . History of prolapse of bladder   . History of pyloric stenosis   . Hypercholesterolemia   . Hypertension   . Thyroid disease     Past Surgical History:  Procedure Laterality Date  . CATARACT EXTRACTION, BILATERAL    . TONSILLECTOMY    . TUBAL LIGATION      Current Medications: Current Meds  Medication Sig  . amLODipine (NORVASC) 5 MG tablet TAKE 1 TABLET BY MOUTH TWICE DAILY (Patient taking differently: Take 5 mg by mouth daily.)  . Calcium Carbonate-Vitamin D (CALCIUM + D PO) Take by mouth daily at 8 pm.  . doxycycline (PERIOSTAT) 20 MG tablet Take 20 mg by mouth 2 (two) times daily.   Marland Kitchen ezetimibe (ZETIA) 10 MG tablet TAKE 1 TABLET BY MOUTH ONCE  DAILY  . levothyroxine (SYNTHROID) 50 MCG tablet Take 1 tablet (50 mcg total) by mouth daily before breakfast.  . losartan (COZAAR) 100 MG tablet TAKE 1 TABLET BY MOUTH ONCE DAILY   Current Facility-Administered Medications for the 07/12/20 encounter (Office Visit) with Vickie Epley, MD  Medication  . 0.9 %  sodium chloride infusion     Allergies:   Patient has no known allergies.   Social History   Socioeconomic History  . Marital status: Married    Spouse name: Not on file  . Number of children: Not on file  . Years of education: Not on file  . Highest education level: Not on file  Occupational History  . Not on file  Tobacco Use  . Smoking status: Never Smoker  . Smokeless tobacco: Never Used  Vaping Use  . Vaping Use: Never used  Substance and Sexual Activity  . Alcohol use: Yes    Alcohol/week: 5.0 standard drinks    Types: 5 Glasses of wine per week    Comment: per week  . Drug use: No  . Sexual activity: Not Currently  Other Topics Concern  . Not on file  Social History Narrative  . Not on file   Social Determinants of Health   Financial Resource Strain: Not on file  Food Insecurity: Not  on file  Transportation Needs: Not on file  Physical Activity: Not on file  Stress: Not on file  Social Connections: Not on file     Family History: The patient's family history includes Heart attack in her maternal grandfather; Heart disease in her father; Hypertension in her mother. There is no history of Colon cancer, Esophageal cancer, Pancreatic cancer, Rectal cancer, or Stomach cancer.  ROS:   Please see the history of present illness.    All other systems reviewed and are negative.  EKGs/Labs/Other Studies Reviewed:    The following studies were reviewed today:   EKG:  The ekg ordered today demonstrates sinus rhythm  Recent Labs: 12/21/2019: Hemoglobin 13.4; Platelets 331.0 06/27/2020: ALT 10; BUN 20; Creatinine, Ser 1.11; Potassium 4.3; Sodium 139; TSH  8.85  Recent Lipid Panel    Component Value Date/Time   CHOL 242 (H) 06/27/2020 0908   TRIG 141.0 06/27/2020 0908   HDL 78.20 06/27/2020 0908   CHOLHDL 3 06/27/2020 0908   VLDL 28.2 06/27/2020 0908   LDLCALC 136 (H) 06/27/2020 0908    Physical Exam:    VS:  BP (!) 148/84 (BP Location: Right Arm, Patient Position: Sitting, Cuff Size: Normal)   Pulse 83   Ht 5\' 8"  (1.727 m)   Wt 164 lb 2 oz (74.4 kg)   SpO2 98%   BMI 24.96 kg/m     Wt Readings from Last 3 Encounters:  07/12/20 164 lb 2 oz (74.4 kg)  06/29/20 163 lb 3.2 oz (74 kg)  12/23/19 165 lb 6.4 oz (75 kg)     GEN:  Well nourished, well developed in no acute distress HEENT: Normal NECK: No JVD; No carotid bruits LYMPHATICS: No lymphadenopathy CARDIAC: RRR, no murmurs, rubs, gallops RESPIRATORY:  Clear to auscultation without rales, wheezing or rhonchi  ABDOMEN: Soft, non-tender, non-distended MUSCULOSKELETAL:  No edema; No deformity  SKIN: Warm and dry NEUROLOGIC:  Alert and oriented x 3 PSYCHIATRIC:  Normal affect   ASSESSMENT:    1. Tachycardia    PLAN:    In order of problems listed above:  1. Unclear exact etiology for her palpitations that awoke her from sleep recently.  The concern is that this could have represented an episode of atrial fibrillation.  We discussed atrial fibrillation briefly during today's visit and its associated stroke risk.  We will plan to evaluate with a 2-week ZIO monitor to see if there are recurrent episodes of arrhythmias.  I will plan to touch base with her in about 6 weeks to review the results.   Medication Adjustments/Labs and Tests Ordered: Current medicines are reviewed at length with the patient today.  Concerns regarding medicines are outlined above.  Orders Placed This Encounter  Procedures  . LONG TERM MONITOR (3-14 DAYS)   No orders of the defined types were placed in this encounter.    Signed, Lars Mage, MD, [facility]  07/12/2020 12:26 PM     Electrophysiology [place]

## 2020-07-12 NOTE — Patient Instructions (Signed)
Medication Instructions:  - Your physician recommends that you continue on your current medications as directed. Please refer to the Current Medication list given to you today.  *If you need a refill on your cardiac medications before your next appointment, please call your pharmacy*   Lab Work: - none ordered  If you have labs (blood work) drawn today and your tests are completely normal, you will receive your results only by: Marland Kitchen MyChart Message (if you have MyChart) OR . A paper copy in the mail If you have any lab test that is abnormal or we need to change your treatment, we will call you to review the results.   Testing/Procedures: 1) 14 day heart monitor (Wednesday 07/12/20- Wednesday 07/26/20)  - Your physician has recommended that you wear a 14 day Zio XT (heart) monitor- placed in office today. This monitor is a medical device that records the heart's electrical activity. Doctors most often use these monitors to diagnose arrhythmias. Arrhythmias are problems with the speed or rhythm of the heartbeat. The monitor is a small device applied to your chest. You can wear one while you do your normal daily activities. While wearing this monitor if you have any symptoms to push the button and record what you felt. Once you have worn this monitor for the period of time provider prescribed (Usually 14 days), you will return the monitor device in the postage paid box. Once it is returned they will download the data collected and provide [place] with a report which the provider will then review and we will call you with those results. Important tips:  1. Avoid showering during the first 24 hours of wearing the monitor. 2. Avoid excessive sweating to help maximize wear time. 3. Do not submerge the device, no hot tubs, and no swimming pools. 4. Keep any lotions or oils away from the patch. 5. After 24 hours you may shower with the patch on. Take brief showers with your back facing the shower head.  6. Do  not remove patch once it has been placed because that will interrupt data and decrease adhesive wear time. 7. Push the button when you have any symptoms and write down what you were feeling. 8. Once you have completed wearing your monitor, remove and place into box which has postage paid and place in your outgoing mailbox.  9. If for some reason you have misplaced your box then call our office and we can provide another box and/or mail it off for you.         Follow-Up: At [facility], you and your health needs are our priority.  As part of our continuing mission to provide you with exceptional heart care, we have created designated Provider Care Teams.  These Care Teams include your primary Cardiologist (physician) and Advanced Practice Providers (APPs -  Physician Assistants and Nurse Practitioners) who all work together to provide you with the care you need, when you need it.  We recommend signing up for the patient portal called "MyChart".  Sign up information is provided on this After Visit Summary.  MyChart is used to connect with patients for Virtual Visits (Telemedicine).  Patients are able to view lab/test results, encounter notes, upcoming appointments, etc.  Non-urgent messages can be sent to your provider as well.   To learn more about what you can do with MyChart, go to [email or web address].    Your next appointment:   6-7 week(s)  The format for your next appointment:   In  Person  Provider:   Lars Mage, MD   Other Instructions n/a

## 2020-08-02 DIAGNOSIS — R000 Tachycardia, unspecified: Secondary | ICD-10-CM | POA: Diagnosis not present

## 2020-08-18 IMAGING — DX DG CHEST 2V
2 series · 2 of 2 positions shown · non-contrast
Comparison: None.

CLINICAL DATA: Cough and congestion with wheezing

EXAM:
CHEST - 2 VIEW

[chest pa]
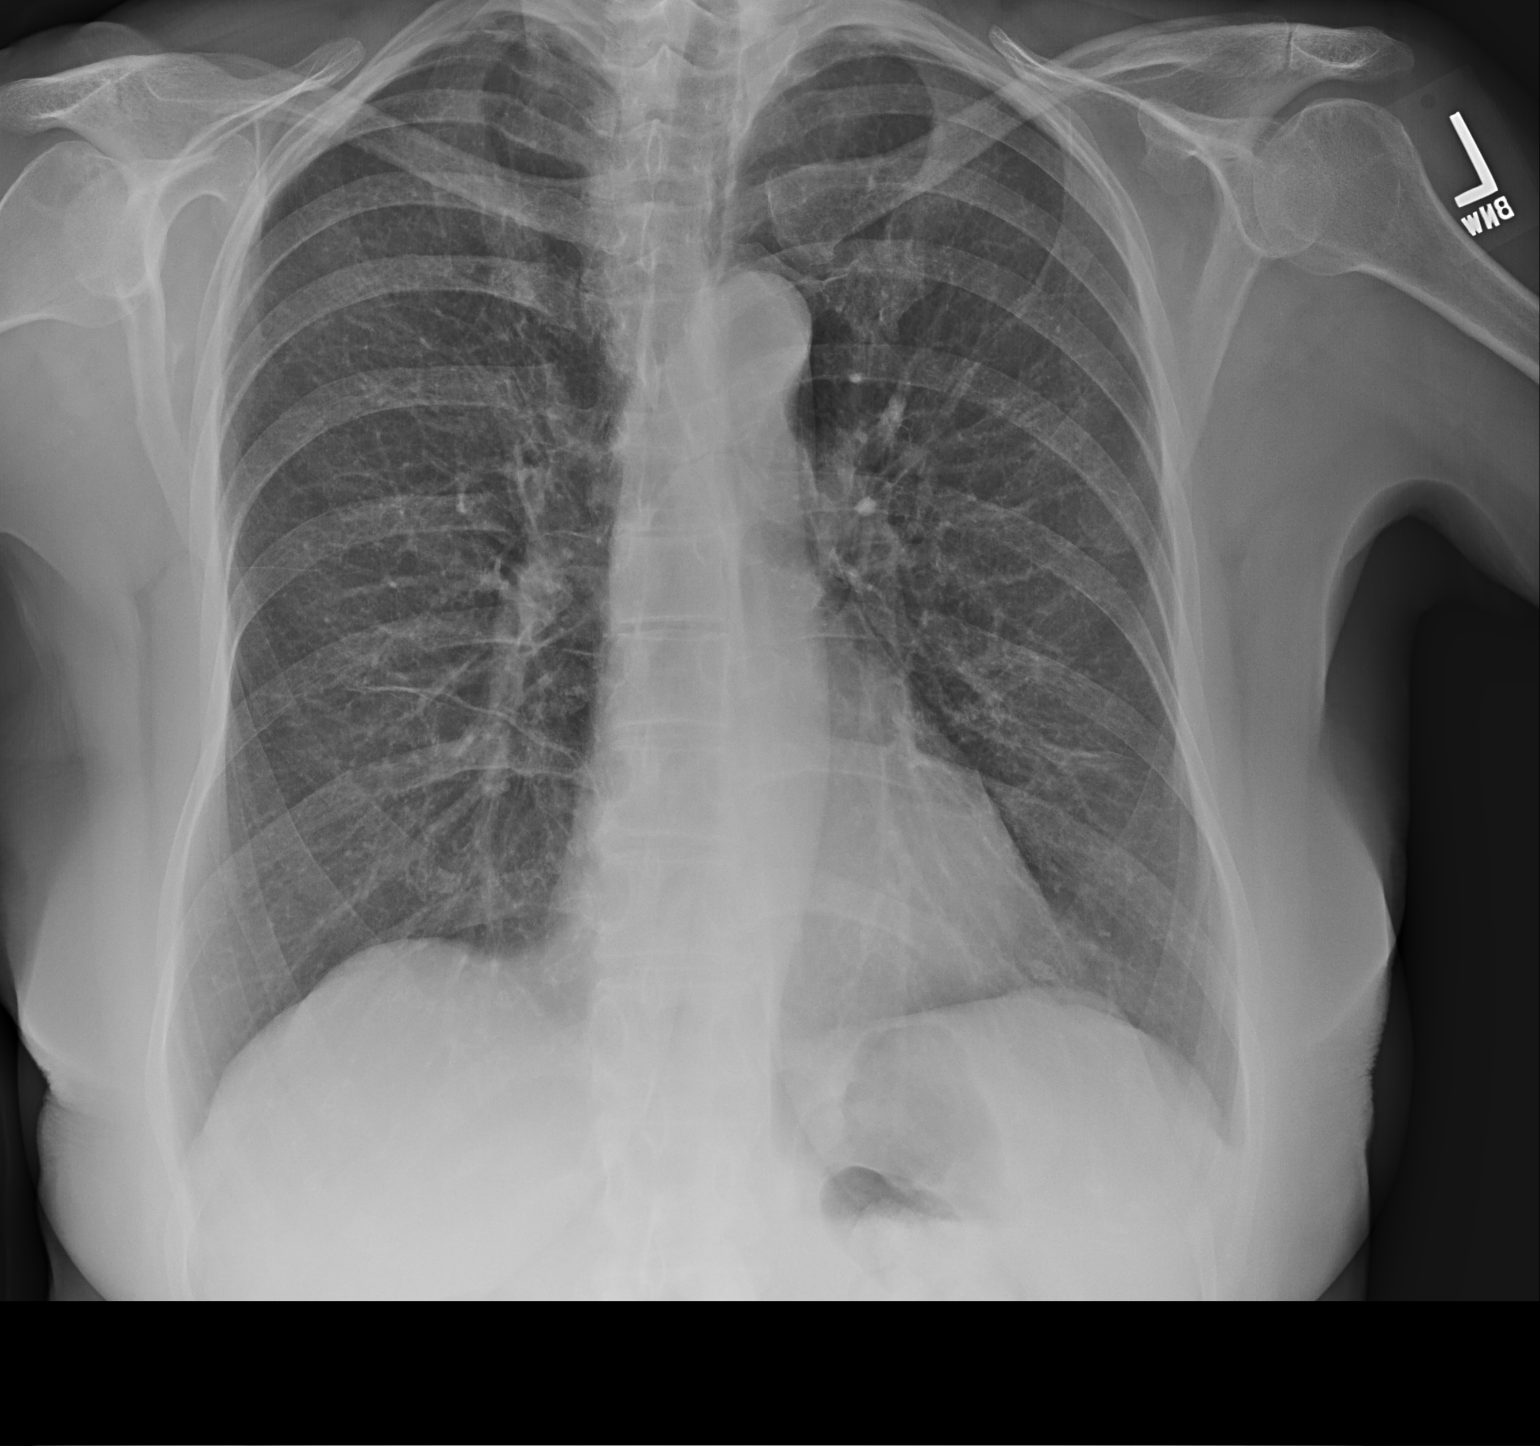

[chest lat]
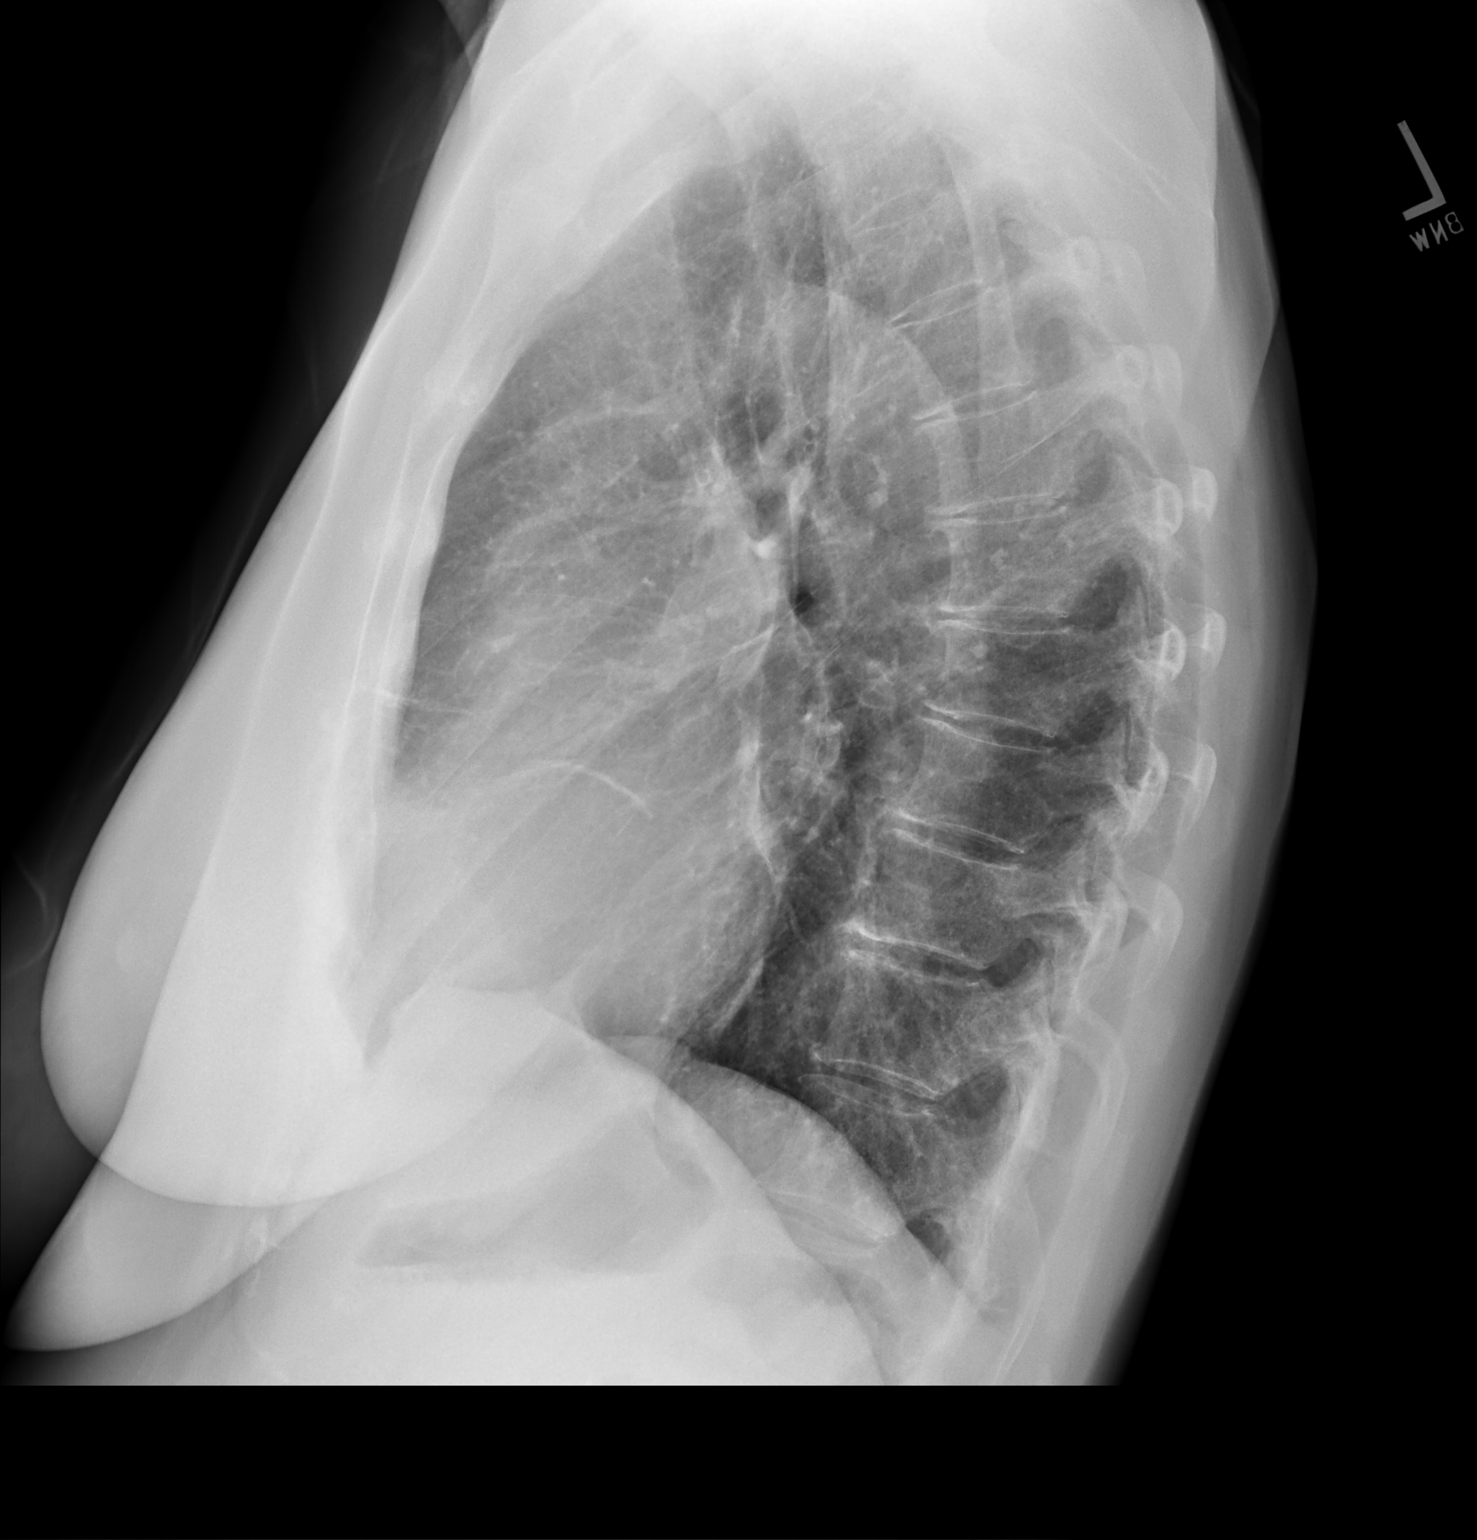

[2 of 2 positions shown; findings below may reference images not displayed]

FINDINGS: Mild linear opacity in both lungs. There is no edema, consolidation,
effusion, or pneumothorax. Normal heart size and mediastinal
contours.
IMPRESSION: Mild atelectasis or scarring on both sides.  No pneumonia.

## 2020-08-23 ENCOUNTER — Encounter: Payer: Self-pay | Admitting: Cardiology

## 2020-08-23 ENCOUNTER — Ambulatory Visit (INDEPENDENT_AMBULATORY_CARE_PROVIDER_SITE_OTHER): Payer: Medicare Other | Admitting: Cardiology

## 2020-08-23 VITALS — BP 132/72 | HR 71 | Ht 68.0 in | Wt 169.0 lb

## 2020-08-23 DIAGNOSIS — R000 Tachycardia, unspecified: Secondary | ICD-10-CM

## 2020-08-23 NOTE — Progress Notes (Signed)
Electrophysiology Office Follow up Visit Note:    Date:  08/23/2020   ID:  Shelby Spencer, DOB Sep 22, 1945, MRN [id number]  PCP:  Einar Pheasant, MD  [place] Cardiologist:  No primary care provider on file.  [facility] Electrophysiologist:  Vickie Epley, MD    Interval History:    Shelby Spencer is a 75 y.o. female who presents for a follow up visit.  I last saw the patient July 12, 2020 for tachycardia and palpitations.  Since then she wore a ZIO monitor.  She tells me that since I saw her she has not had as much difficulty with palpitations.  She is overall doing very well.        Past Medical History:  Diagnosis Date  . Cataract   . CKD (chronic kidney disease), stage III ([place])   . History of colon polyps   . History of prolapse of bladder   . History of pyloric stenosis   . Hypercholesterolemia   . Hypertension   . Thyroid disease     Past Surgical History:  Procedure Laterality Date  . CATARACT EXTRACTION, BILATERAL    . TONSILLECTOMY    . TUBAL LIGATION      Current Medications: Current Meds  Medication Sig  . amLODipine (NORVASC) 5 MG tablet Take 5 mg by mouth daily.  . Calcium Carbonate-Vitamin D (CALCIUM + D PO) Take by mouth daily at 8 pm.  . doxycycline (PERIOSTAT) 20 MG tablet Take 20 mg by mouth 2 (two) times daily.   Marland Kitchen ezetimibe (ZETIA) 10 MG tablet TAKE 1 TABLET BY MOUTH ONCE DAILY  . levothyroxine (SYNTHROID) 50 MCG tablet Take 1 tablet (50 mcg total) by mouth daily before breakfast.  . losartan (COZAAR) 100 MG tablet TAKE 1 TABLET BY MOUTH ONCE DAILY   Current Facility-Administered Medications for the 08/23/20 encounter (Office Visit) with Vickie Epley, MD  Medication  . 0.9 %  sodium chloride infusion     Allergies:   Patient has no known allergies.   Social History   Socioeconomic History  . Marital status: Married    Spouse name: Not on file  . Number of children: Not on file  . Years of education: Not on file   . Highest education level: Not on file  Occupational History  . Not on file  Tobacco Use  . Smoking status: Never Smoker  . Smokeless tobacco: Never Used  Vaping Use  . Vaping Use: Never used  Substance and Sexual Activity  . Alcohol use: Yes    Alcohol/week: 5.0 standard drinks    Types: 5 Glasses of wine per week    Comment: per week  . Drug use: No  . Sexual activity: Not Currently  Other Topics Concern  . Not on file  Social History Narrative  . Not on file   Social Determinants of Health   Financial Resource Strain: Not on file  Food Insecurity: Not on file  Transportation Needs: Not on file  Physical Activity: Not on file  Stress: Not on file  Social Connections: Not on file     Family History: The patient's family history includes Heart attack in her maternal grandfather; Heart disease in her father; Hypertension in her mother. There is no history of Colon cancer, Esophageal cancer, Pancreatic cancer, Rectal cancer, or Stomach cancer.  ROS:   Please see the history of present illness.    All other systems reviewed and are negative.  EKGs/Labs/Other Studies Reviewed:  The following studies were reviewed today:  August 02, 2020 ZIO personally reviewed HR 48-187, average 73bpm.  Rare supraventricular and ventricular ectopy. Rare episodes of nonsustained SVT, likely AT. No sustained arrhythmias.     Recent Labs: 12/21/2019: Hemoglobin 13.4; Platelets 331.0 06/27/2020: ALT 10; BUN 20; Creatinine, Ser 1.11; Potassium 4.3; Sodium 139; TSH 8.85  Recent Lipid Panel    Component Value Date/Time   CHOL 242 (H) 06/27/2020 0908   TRIG 141.0 06/27/2020 0908   HDL 78.20 06/27/2020 0908   CHOLHDL 3 06/27/2020 0908   VLDL 28.2 06/27/2020 0908   LDLCALC 136 (H) 06/27/2020 0908    Physical Exam:    VS:  BP 132/72   Pulse 71   Ht 5\' 8"  (1.727 m)   Wt 169 lb (76.7 kg)   SpO2 95%   BMI 25.70 kg/m     Wt Readings from Last 3 Encounters:  08/23/20 169 lb  (76.7 kg)  07/12/20 164 lb 2 oz (74.4 kg)  06/29/20 163 lb 3.2 oz (74 kg)     GEN:  Well nourished, well developed in no acute distress HEENT: Normal NECK: No JVD; No carotid bruits LYMPHATICS: No lymphadenopathy CARDIAC: RRR, no murmurs, rubs, gallops RESPIRATORY:  Clear to auscultation without rales, wheezing or rhonchi  ABDOMEN: Soft, non-tender, non-distended MUSCULOSKELETAL:  No edema; No deformity  SKIN: Warm and dry NEUROLOGIC:  Alert and oriented x 3 PSYCHIATRIC:  Normal affect   ASSESSMENT:    1. Tachycardia    PLAN:    In order of problems listed above:  1. Tachycardia/palpitations Likely short runs of an atrial tachycardia.  No sustained arrhythmias on the monitor.  She tells me she is doing very well.  Follow-up as needed.  Medication Adjustments/Labs and Tests Ordered: Current medicines are reviewed at length with the patient today.  Concerns regarding medicines are outlined above.  No orders of the defined types were placed in this encounter.  No orders of the defined types were placed in this encounter.    Signed, Lars Mage, MD, [facility]  08/23/2020 6:25 PM    Electrophysiology [place]

## 2020-08-23 NOTE — Patient Instructions (Signed)
Medication Instructions:  Your physician recommends that you continue on your current medications as directed. Please refer to the Current Medication list given to you today. *If you need a refill on your cardiac medications before your next appointment, please call your pharmacy*  Lab Work: None ordered. If you have labs (blood work) drawn today and your tests are completely normal, you will receive your results only by: MyChart Message (if you have MyChart) OR A paper copy in the mail If you have any lab test that is abnormal or we need to change your treatment, we will call you to review the results.  Testing/Procedures: None ordered.  Follow-Up: At [facility], you and your health needs are our priority.  As part of our continuing mission to provide you with exceptional heart care, we have created designated Provider Care Teams.  These Care Teams include your primary Cardiologist (physician) and Advanced Practice Providers (APPs -  Physician Assistants and Nurse Practitioners) who all work together to provide you with the care you need, when you need it.  Your next appointment:   Your physician wants you to follow-up as needed   

## 2020-08-30 DIAGNOSIS — Z23 Encounter for immunization: Secondary | ICD-10-CM | POA: Diagnosis not present

## 2020-09-01 ENCOUNTER — Encounter: Payer: Self-pay | Admitting: Internal Medicine

## 2020-09-01 ENCOUNTER — Ambulatory Visit (INDEPENDENT_AMBULATORY_CARE_PROVIDER_SITE_OTHER): Payer: Medicare Other | Admitting: Internal Medicine

## 2020-09-01 ENCOUNTER — Other Ambulatory Visit: Payer: Self-pay

## 2020-09-01 DIAGNOSIS — E039 Hypothyroidism, unspecified: Secondary | ICD-10-CM | POA: Diagnosis not present

## 2020-09-01 DIAGNOSIS — E7800 Pure hypercholesterolemia, unspecified: Secondary | ICD-10-CM

## 2020-09-01 DIAGNOSIS — I701 Atherosclerosis of renal artery: Secondary | ICD-10-CM | POA: Diagnosis not present

## 2020-09-01 DIAGNOSIS — I10 Essential (primary) hypertension: Secondary | ICD-10-CM | POA: Diagnosis not present

## 2020-09-01 MED ORDER — LEVOTHYROXINE SODIUM 50 MCG PO TABS: 50.0000 ug | ORAL_TABLET | Freq: Every day | ORAL | 2 refills | Status: DC

## 2020-09-01 MED ORDER — EZETIMIBE 10 MG PO TABS
10.0000 mg | ORAL_TABLET | Freq: Every day | ORAL | 3 refills | Status: DC
Start: 2020-09-01 — End: 2021-07-03

## 2020-09-01 MED ORDER — LOSARTAN POTASSIUM 100 MG PO TABS: 100.0000 mg | ORAL_TABLET | Freq: Every day | ORAL | 1 refills | Status: DC

## 2020-09-01 NOTE — Progress Notes (Signed)
Patient ID: Shelby Spencer, female   DOB: April 19, 1946, 74 y.o.   MRN: [id number]   Subjective:    Patient ID: Shelby Spencer, female    DOB: [birth year], 76 y.o.   MRN: [id number]  HPI This visit occurred during the SARS-CoV-2 public health emergency.  Safety protocols were in place, including screening questions prior to the visit, additional usage of staff PPE, and extensive cleaning of exam room while observing appropriate contact time as indicated for disinfecting solutions.  Patient here for a scheduled follow up.  She previously reported tachycardia and palpitations.  Saw cardiology.  Wore a zio monitor.  ZIO - per report - rare episodes of nonsustained SVT.  No sustained arrhythmias on the monitor.  No changes made.  Has not noticed any problems recently.  No chest pain or sob reported.  Stays active.  No abdominal pain or bowel change reported.  Had fourth covid vaccine - maderna - 08/30/20.    Past Medical History:  Diagnosis Date  . Cataract   . CKD (chronic kidney disease), stage III ([place])   . History of colon polyps   . History of prolapse of bladder   . History of pyloric stenosis   . Hypercholesterolemia   . Hypertension   . Thyroid disease    Past Surgical History:  Procedure Laterality Date  . CATARACT EXTRACTION, BILATERAL    . TONSILLECTOMY    . TUBAL LIGATION     Family History  Problem Relation Age of Onset  . Hypertension Mother   . Heart disease Father   . Heart attack Maternal Grandfather   . Colon cancer Neg Hx   . Esophageal cancer Neg Hx   . Pancreatic cancer Neg Hx   . Rectal cancer Neg Hx   . Stomach cancer Neg Hx    Social History   Socioeconomic History  . Marital status: Married    Spouse name: Not on file  . Number of children: Not on file  . Years of education: Not on file  . Highest education level: Not on file  Occupational History  . Not on file  Tobacco Use  . Smoking status: Never Smoker  . Smokeless tobacco: Never Used  Vaping Use   . Vaping Use: Never used  Substance and Sexual Activity  . Alcohol use: Yes    Alcohol/week: 5.0 standard drinks    Types: 5 Glasses of wine per week    Comment: per week  . Drug use: No  . Sexual activity: Not Currently  Other Topics Concern  . Not on file  Social History Narrative  . Not on file   Social Determinants of Health   Financial Resource Strain: Not on file  Food Insecurity: Not on file  Transportation Needs: Not on file  Physical Activity: Not on file  Stress: Not on file  Social Connections: Not on file    Outpatient Encounter Medications as of 09/01/2020  Medication Sig  . amLODipine (NORVASC) 5 MG tablet Take 5 mg by mouth daily.  . Calcium Carbonate-Vitamin D (CALCIUM + D PO) Take by mouth daily at 8 pm.  . doxycycline (PERIOSTAT) 20 MG tablet Take 20 mg by mouth 2 (two) times daily.   . [DISCONTINUED] ezetimibe (ZETIA) 10 MG tablet TAKE 1 TABLET BY MOUTH ONCE DAILY  . [DISCONTINUED] levothyroxine (SYNTHROID) 50 MCG tablet Take 1 tablet (50 mcg total) by mouth daily before breakfast.  . [DISCONTINUED] losartan (COZAAR) 100 MG tablet TAKE 1 TABLET BY MOUTH ONCE  DAILY  . ezetimibe (ZETIA) 10 MG tablet Take 1 tablet (10 mg total) by mouth daily.  Marland Kitchen levothyroxine (SYNTHROID) 50 MCG tablet Take 1 tablet (50 mcg total) by mouth daily before breakfast.  . losartan (COZAAR) 100 MG tablet Take 1 tablet (100 mg total) by mouth daily.   Facility-Administered Encounter Medications as of 09/01/2020  Medication  . 0.9 %  sodium chloride infusion    Review of Systems  Constitutional: Negative for appetite change and unexpected weight change.  HENT: Negative for congestion and sinus pressure.   Respiratory: Negative for cough, chest tightness and shortness of breath.   Cardiovascular: Negative for chest pain, palpitations and leg swelling.  Gastrointestinal: Negative for abdominal pain, diarrhea, nausea and vomiting.  Genitourinary: Negative for difficulty urinating and  dysuria.  Musculoskeletal: Negative for joint swelling and myalgias.  Skin: Negative for color change and rash.  Neurological: Negative for dizziness, light-headedness and headaches.  Psychiatric/Behavioral: Negative for agitation and dysphoric mood.       Objective:    Physical Exam Vitals reviewed.  Constitutional:      General: She is not in acute distress.    Appearance: Normal appearance.  HENT:     Head: Normocephalic and atraumatic.     Right Ear: External ear normal.     Left Ear: External ear normal.  Eyes:     General: No scleral icterus.       Right eye: No discharge.        Left eye: No discharge.     Conjunctiva/sclera: Conjunctivae normal.  Neck:     Thyroid: No thyromegaly.  Cardiovascular:     Rate and Rhythm: Normal rate and regular rhythm.  Pulmonary:     Effort: No respiratory distress.     Breath sounds: Normal breath sounds. No wheezing.  Abdominal:     General: Bowel sounds are normal.     Palpations: Abdomen is soft.     Tenderness: There is no abdominal tenderness.  Musculoskeletal:        General: No swelling or tenderness.     Cervical back: Neck supple. No tenderness.  Lymphadenopathy:     Cervical: No cervical adenopathy.  Skin:    Findings: No erythema or rash.  Neurological:     Mental Status: She is alert.  Psychiatric:        Mood and Affect: Mood normal.        Behavior: Behavior normal.     BP 140/82 (BP Location: Left Arm, Patient Position: Sitting)   Pulse 79   Temp 97.9 F (36.6 C)   Ht 5' 7.99" (1.727 m)   Wt 163 lb 9.6 oz (74.2 kg)   SpO2 96%   BMI 24.88 kg/m  Wt Readings from Last 3 Encounters:  09/01/20 163 lb 9.6 oz (74.2 kg)  08/23/20 169 lb (76.7 kg)  07/12/20 164 lb 2 oz (74.4 kg)     Lab Results  Component Value Date   WBC 9.7 12/21/2019   HGB 13.4 12/21/2019   HCT 39.5 12/21/2019   PLT 331.0 12/21/2019   GLUCOSE 101 (H) 06/27/2020   CHOL 242 (H) 06/27/2020   TRIG 141.0 06/27/2020   HDL 78.20  06/27/2020   LDLCALC 136 (H) 06/27/2020   ALT 10 06/27/2020   AST 10 06/27/2020   NA 139 06/27/2020   K 4.3 06/27/2020   CL 105 06/27/2020   CREATININE 1.11 06/27/2020   BUN 20 06/27/2020   CO2 28 06/27/2020   TSH 8.85 (  H) 06/27/2020       Assessment & Plan:   Problem List Items Addressed This Visit    Hypercholesterolemia    On zetia. Intolerant to statin medication.  Low cholesterol diet and exercise.  Follow lipid panel.       Relevant Medications   ezetimibe (ZETIA) 10 MG tablet   losartan (COZAAR) 100 MG tablet   Other Relevant Orders   Lipid panel   Hepatic function panel   Hypertension, essential    Blood pressures on outside checks under better control.  Continue losartan and amlodipine.  Follow pressures.  Follow metabolic panel.       Relevant Medications   ezetimibe (ZETIA) 10 MG tablet   losartan (COZAAR) 100 MG tablet   Other Relevant Orders   Basic metabolic panel   Hypothyroid    On thyroid replacement.  Follow tsh.       Relevant Medications   levothyroxine (SYNTHROID) 50 MCG tablet   Other Relevant Orders   TSH   Renal artery stenosis (HCC)    Evaluated by [facility] (Dr Bridgett Larsson).   Follow pressure and follow metabolic panel.       Relevant Medications   ezetimibe (ZETIA) 10 MG tablet   losartan (COZAAR) 100 MG tablet       Einar Pheasant, MD

## 2020-09-03 ENCOUNTER — Encounter: Payer: Self-pay | Admitting: Internal Medicine

## 2020-09-03 NOTE — Assessment & Plan Note (Signed)
On zetia.  Intolerant to statin medication.  Low cholesterol diet and exercise.  Follow lipid panel.   

## 2020-09-03 NOTE — Assessment & Plan Note (Signed)
Blood pressures on outside checks under better control.  Continue losartan and amlodipine.  Follow pressures.  Follow metabolic panel.

## 2020-09-03 NOTE — Assessment & Plan Note (Signed)
Evaluated by [facility] (Dr Chen).   Follow pressure and follow metabolic panel.  

## 2020-09-03 NOTE — Assessment & Plan Note (Signed)
On thyroid replacement.  Follow tsh.  

## 2020-10-04 ENCOUNTER — Encounter: Payer: Self-pay | Admitting: Obstetrics & Gynecology

## 2020-10-04 ENCOUNTER — Ambulatory Visit (INDEPENDENT_AMBULATORY_CARE_PROVIDER_SITE_OTHER): Payer: Medicare Other | Admitting: Obstetrics & Gynecology

## 2020-10-04 ENCOUNTER — Other Ambulatory Visit: Payer: Self-pay

## 2020-10-04 VITALS — BP 136/88 | Ht 67.25 in | Wt 162.0 lb

## 2020-10-04 DIAGNOSIS — M8589 Other specified disorders of bone density and structure, multiple sites: Secondary | ICD-10-CM | POA: Diagnosis not present

## 2020-10-04 DIAGNOSIS — Z780 Asymptomatic menopausal state: Secondary | ICD-10-CM | POA: Diagnosis not present

## 2020-10-04 DIAGNOSIS — Z01419 Encounter for gynecological examination (general) (routine) without abnormal findings: Secondary | ICD-10-CM

## 2020-10-04 DIAGNOSIS — N8110 Cystocele, unspecified: Secondary | ICD-10-CM

## 2020-10-04 NOTE — Progress Notes (Signed)
Shelby Spencer 1946-04-04 [id number]   History:    75 y.o. G3P2A1 Married.  Biologist Education administrator).  Has grand-children.  RP:  New (>3 yrs) patient presenting for annual gyn exam   HPI: Postmenopause, well on no HRT.  No PMB.  Small bladder prolapsed for which I did counseling for her about 6 yrs ago.  We decided to observe with avoidance of pelvic floor pressure.  She was well until 3 weeks ago when she had a fall, tripping over a mat on the floor, and started feeling more of a bulge at the vulva since then.  No vaginal pain.  No Urinary incontinence, except very rarely when the bladder is too full.  BMs normal.  Abstinent.  Breasts normal.  Screening mammo neg 07/2020.  BD Osteopenia in 06/2018.  Colono 2018.  Health labs with Fam MD.  BMI 25.18. Continue with fitness and healthy nutrition.  Past medical history,surgical history, family history and social history were all reviewed and documented in the EPIC chart.  Gynecologic History No LMP recorded. Patient is postmenopausal.  Obstetric History OB History  Gravida Para Term Preterm AB Living  3 2     1 2   SAB IAB Ectopic Multiple Live Births  1            # Outcome Date GA Lbr Len/2nd Weight Sex Delivery Anes PTL Lv  3 SAB           2 Para           1 Para              ROS: A ROS was performed and pertinent positives and negatives are included in the history.  GENERAL: No fevers or chills. HEENT: No change in vision, no earache, sore throat or sinus congestion. NECK: No pain or stiffness. CARDIOVASCULAR: No chest pain or pressure. No palpitations. PULMONARY: No shortness of breath, cough or wheeze. GASTROINTESTINAL: No abdominal pain, nausea, vomiting or diarrhea, melena or bright red blood per rectum. GENITOURINARY: No urinary frequency, urgency, hesitancy or dysuria. MUSCULOSKELETAL: No joint or muscle pain, no back pain, no recent trauma. DERMATOLOGIC: No rash, no itching, no lesions. ENDOCRINE: No polyuria, polydipsia, no  heat or cold intolerance. No recent change in weight. HEMATOLOGICAL: No anemia or easy bruising or bleeding. NEUROLOGIC: No headache, seizures, numbness, tingling or weakness. PSYCHIATRIC: No depression, no loss of interest in normal activity or change in sleep pattern.     Exam:   BP 136/88   Ht 5' 7.25" (1.708 m)   Wt 162 lb (73.5 kg)   BMI 25.18 kg/m   Body mass index is 25.18 kg/m.  General appearance : Well developed well nourished female. No acute distress HEENT: Eyes: no retinal hemorrhage or exudates,  Neck supple, trachea midline, no carotid bruits, no thyroidmegaly Lungs: Clear to auscultation, no rhonchi or wheezes, or rib retractions  Heart: Regular rate and rhythm, no murmurs or gallops Breast:Examined in sitting and supine position were symmetrical in appearance, no palpable masses or tenderness,  no skin retraction, no nipple inversion, no nipple discharge, no skin discoloration, no axillary or supraclavicular lymphadenopathy Abdomen: no palpable masses or tenderness, no rebound or guarding Extremities: no edema or skin discoloration or tenderness  Pelvic: Vulva: Normal             Vagina: No gross lesions or discharge.  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  Cervix: No gross lesions or discharge  Uterus  AV, normal size, shape and  consistency, non-tender and mobile  Adnexa  Without masses or tenderness  Anus: Normal   Assessment/Plan:  75 y.o. female for annual exam   1. Well female exam with routine gynecological exam Gynecologic exam in menopause with cystocele grade 3/4.  Previous Pap tests always normal.  Abstinent.  No indication to repeat a Pap test at this time.  Breast exam normal.  Screening mammogram was negative in February 2022.  Colonoscopy 2018.  Health labs with Dr. Nicki Reaper.  Body mass index 25.18.  Continue with fitness and healthy nutrition.  2. Postmenopausal Well on no hormone replacement therapy.  No postmenopausal bleeding.  3. Osteopenia of  multiple sites Last bone density in January 2020 showed osteopenia.  We will repeat a bone density through her family physician.  Vitamin D supplements, calcium intake of 1.5 g total daily and regular weightbearing physical activity is recommended.  4. 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  Mildly symptomatic.  Counseling done on cystoceles.  Management reviewed.  Decision made to continue observation.  Will avoid pelvic floor pressure and emptying her bladder frequently before it fills up too much.  Eventual management with pessary or surgical correction reviewed thoroughly with patient.  Princess Bruins MD, 10:36 AM 10/04/2020

## 2020-11-29 ENCOUNTER — Other Ambulatory Visit (INDEPENDENT_AMBULATORY_CARE_PROVIDER_SITE_OTHER): Payer: Medicare Other

## 2020-11-29 ENCOUNTER — Other Ambulatory Visit: Payer: Self-pay

## 2020-11-29 ENCOUNTER — Other Ambulatory Visit: Payer: Medicare Other

## 2020-11-29 DIAGNOSIS — I10 Essential (primary) hypertension: Secondary | ICD-10-CM | POA: Diagnosis not present

## 2020-11-29 DIAGNOSIS — E039 Hypothyroidism, unspecified: Secondary | ICD-10-CM

## 2020-11-29 DIAGNOSIS — E7800 Pure hypercholesterolemia, unspecified: Secondary | ICD-10-CM

## 2020-11-29 LAB — HEPATIC FUNCTION PANEL
ALT: 10 U/L (ref 0–35)
AST: 10 U/L (ref 0–37)
Albumin: 4.3 g/dL (ref 3.5–5.2)
Alkaline Phosphatase: 72 U/L (ref 39–117)
Bilirubin, Direct: 0.1 mg/dL (ref 0.0–0.3)
Total Bilirubin: 0.7 mg/dL (ref 0.2–1.2)
Total Protein: 6.4 g/dL (ref 6.0–8.3)

## 2020-11-29 LAB — LIPID PANEL
Cholesterol: 225 mg/dL — ABNORMAL HIGH (ref 0–200)
HDL: 74.6 mg/dL (ref >39.00)
LDL Cholesterol: 133 mg/dL — ABNORMAL HIGH (ref 0–99)
NonHDL: 149.98
Total CHOL/HDL Ratio: 3
Triglycerides: 87 mg/dL (ref 0.0–149.0)
VLDL: 17.4 mg/dL (ref 0.0–40.0)

## 2020-11-29 LAB — BASIC METABOLIC PANEL
BUN: 29 mg/dL — ABNORMAL HIGH (ref 6–23)
CO2: 25 mEq/L (ref 19–32)
Calcium: 9.6 mg/dL (ref 8.4–10.5)
Chloride: 104 mEq/L (ref 96–112)
Creatinine, Ser: 1.15 mg/dL (ref 0.40–1.20)
GFR: 47.1 mL/min — ABNORMAL LOW (ref >60.00)
Glucose, Bld: 87 mg/dL (ref 70–99)
Potassium: 4.6 mEq/L (ref 3.5–5.1)
Sodium: 138 mEq/L (ref 135–145)

## 2020-11-29 LAB — TSH: TSH: 0.3 u[IU]/mL — ABNORMAL LOW (ref 0.35–4.50)

## 2020-11-30 NOTE — Addendum Note (Signed)
Addended by: Nanci Pina on: 11/30/2020 12:39 PM   Modules accepted: Orders

## 2020-12-01 ENCOUNTER — Ambulatory Visit: Payer: Medicare Other | Admitting: Internal Medicine

## 2020-12-19 DIAGNOSIS — H26493 Other secondary cataract, bilateral: Secondary | ICD-10-CM | POA: Diagnosis not present

## 2021-01-05 ENCOUNTER — Other Ambulatory Visit: Payer: Self-pay | Admitting: *Deleted

## 2021-01-05 DIAGNOSIS — E039 Hypothyroidism, unspecified: Secondary | ICD-10-CM

## 2021-01-08 ENCOUNTER — Other Ambulatory Visit (INDEPENDENT_AMBULATORY_CARE_PROVIDER_SITE_OTHER): Payer: Medicare Other

## 2021-01-08 ENCOUNTER — Other Ambulatory Visit: Payer: Self-pay

## 2021-01-08 DIAGNOSIS — E039 Hypothyroidism, unspecified: Secondary | ICD-10-CM | POA: Diagnosis not present

## 2021-01-08 LAB — TSH: TSH: 1.12 u[IU]/mL (ref 0.35–5.50)

## 2021-01-09 DIAGNOSIS — I87393 Chronic venous hypertension (idiopathic) with other complications of bilateral lower extremity: Secondary | ICD-10-CM | POA: Diagnosis not present

## 2021-01-10 ENCOUNTER — Other Ambulatory Visit: Payer: Self-pay | Admitting: Internal Medicine

## 2021-01-22 ENCOUNTER — Ambulatory Visit: Payer: Medicare Other | Admitting: Internal Medicine

## 2021-02-06 DIAGNOSIS — I87393 Chronic venous hypertension (idiopathic) with other complications of bilateral lower extremity: Secondary | ICD-10-CM | POA: Diagnosis not present

## 2021-02-12 ENCOUNTER — Ambulatory Visit: Payer: Medicare Other | Admitting: Internal Medicine

## 2021-02-13 DIAGNOSIS — I87393 Chronic venous hypertension (idiopathic) with other complications of bilateral lower extremity: Secondary | ICD-10-CM | POA: Diagnosis not present

## 2021-02-15 DIAGNOSIS — Z23 Encounter for immunization: Secondary | ICD-10-CM | POA: Diagnosis not present

## 2021-03-20 DIAGNOSIS — Z23 Encounter for immunization: Secondary | ICD-10-CM | POA: Diagnosis not present

## 2021-03-26 ENCOUNTER — Telehealth: Payer: Self-pay | Admitting: Internal Medicine

## 2021-03-26 ENCOUNTER — Other Ambulatory Visit: Payer: Self-pay

## 2021-03-26 ENCOUNTER — Ambulatory Visit (INDEPENDENT_AMBULATORY_CARE_PROVIDER_SITE_OTHER): Payer: Medicare Other | Admitting: Internal Medicine

## 2021-03-26 VITALS — BP 128/78 | HR 75 | Temp 97.6°F | Resp 16 | Ht 67.0 in | Wt 165.0 lb

## 2021-03-26 DIAGNOSIS — Z86010 Personal history of colonic polyps: Secondary | ICD-10-CM | POA: Diagnosis not present

## 2021-03-26 DIAGNOSIS — E7800 Pure hypercholesterolemia, unspecified: Secondary | ICD-10-CM

## 2021-03-26 DIAGNOSIS — I10 Essential (primary) hypertension: Secondary | ICD-10-CM

## 2021-03-26 DIAGNOSIS — I701 Atherosclerosis of renal artery: Secondary | ICD-10-CM | POA: Diagnosis not present

## 2021-03-26 DIAGNOSIS — E039 Hypothyroidism, unspecified: Secondary | ICD-10-CM

## 2021-03-26 LAB — CBC WITH DIFFERENTIAL/PLATELET
Basophils Absolute: 0.1 10*3/uL (ref 0.0–0.1)
Basophils Relative: 1.3 % (ref 0.0–3.0)
Eosinophils Absolute: 0.2 10*3/uL (ref 0.0–0.7)
Eosinophils Relative: 4 % (ref 0.0–5.0)
HCT: 40.2 % (ref 36.0–46.0)
Hemoglobin: 13.3 g/dL (ref 12.0–15.0)
Lymphocytes Relative: 36.9 % (ref 12.0–46.0)
Lymphs Abs: 2 10*3/uL (ref 0.7–4.0)
MCHC: 33.1 g/dL (ref 30.0–36.0)
MCV: 92.6 fl (ref 78.0–100.0)
Monocytes Absolute: 0.6 10*3/uL (ref 0.1–1.0)
Monocytes Relative: 11.3 % (ref 3.0–12.0)
Neutro Abs: 2.5 10*3/uL (ref 1.4–7.7)
Neutrophils Relative %: 46.5 % (ref 43.0–77.0)
Platelets: 340 10*3/uL (ref 150.0–400.0)
RBC: 4.34 Mil/uL (ref 3.87–5.11)
RDW: 13.8 % (ref 11.5–15.5)
WBC: 5.4 10*3/uL (ref 4.0–10.5)

## 2021-03-26 LAB — LIPID PANEL
Cholesterol: 221 mg/dL — ABNORMAL HIGH (ref 0–200)
HDL: 82.9 mg/dL (ref >39.00)
LDL Cholesterol: 121 mg/dL — ABNORMAL HIGH (ref 0–99)
NonHDL: 137.91
Total CHOL/HDL Ratio: 3
Triglycerides: 83 mg/dL (ref 0.0–149.0)
VLDL: 16.6 mg/dL (ref 0.0–40.0)

## 2021-03-26 LAB — HEPATIC FUNCTION PANEL
ALT: 13 U/L (ref 0–35)
AST: 13 U/L (ref 0–37)
Albumin: 4.4 g/dL (ref 3.5–5.2)
Alkaline Phosphatase: 72 U/L (ref 39–117)
Bilirubin, Direct: 0.1 mg/dL (ref 0.0–0.3)
Total Bilirubin: 0.6 mg/dL (ref 0.2–1.2)
Total Protein: 6.7 g/dL (ref 6.0–8.3)

## 2021-03-26 LAB — TSH: TSH: 3.85 u[IU]/mL (ref 0.35–5.50)

## 2021-03-26 LAB — BASIC METABOLIC PANEL
BUN: 24 mg/dL — ABNORMAL HIGH (ref 6–23)
CO2: 24 mEq/L (ref 19–32)
Calcium: 9.5 mg/dL (ref 8.4–10.5)
Chloride: 106 mEq/L (ref 96–112)
Creatinine, Ser: 1.14 mg/dL (ref 0.40–1.20)
GFR: 47.49 mL/min — ABNORMAL LOW (ref >60.00)
Glucose, Bld: 104 mg/dL — ABNORMAL HIGH (ref 70–99)
Potassium: 4.3 mEq/L (ref 3.5–5.1)
Sodium: 139 mEq/L (ref 135–145)

## 2021-03-26 NOTE — Telephone Encounter (Signed)
Patient saw Dr Nicki Reaper today and she wanted her to know the medication amLODipine (NORVASC) 2.5, and should be taken twice a day.

## 2021-03-26 NOTE — Progress Notes (Signed)
Patient ID: Shelby Spencer, female   DOB: May 14, 1946, 75 y.o.   MRN: [id number]   Subjective:    Patient ID: Shelby Spencer, female    DOB: [birth year], 75 y.o.   MRN: [id number]  This visit occurred during the SARS-CoV-2 public health emergency.  Safety protocols were in place, including screening questions prior to the visit, additional usage of staff PPE, and extensive cleaning of exam room while observing appropriate contact time as indicated for disinfecting solutions.   Patient here for a scheduled follow up.  Marland Kitchen   HPI Here to follow up regarding her blood pressure and cholesterol.  She is doing well.  Feels good.  Stays active.  No chest pain or sob reported.  No acid reflux.  No abdominal pain.  Bowels moving. Blood pressures ok - low 283M systolic readings.    Past Medical History:  Diagnosis Date   Cataract    CKD (chronic kidney disease), stage III ([place])    History of colon polyps    History of prolapse of bladder    History of pyloric stenosis    Hypercholesterolemia    Hypertension    Thyroid disease    Past Surgical History:  Procedure Laterality Date   CATARACT EXTRACTION, BILATERAL     TONSILLECTOMY     TUBAL LIGATION     Family History  Problem Relation Age of Onset   Hypertension Mother    Heart disease Father    Heart attack Maternal Grandfather    Colon cancer Neg Hx    Esophageal cancer Neg Hx    Pancreatic cancer Neg Hx    Rectal cancer Neg Hx    Stomach cancer Neg Hx    Social History   Socioeconomic History   Marital status: Married    Spouse name: Not on file   Number of children: Not on file   Years of education: Not on file   Highest education level: Not on file  Occupational History   Not on file  Tobacco Use   Smoking status: Never   Smokeless tobacco: Never  Vaping Use   Vaping Use: Never used  Substance and Sexual Activity   Alcohol use: Yes    Alcohol/week: 5.0 standard drinks    Types: 5 Glasses of wine per week    Comment: per  week   Drug use: No   Sexual activity: Not Currently    Partners: Male    Comment: 1st intercourse-21, partners- 2, married- 49.5 yrs  Other Topics Concern   Not on file  Social History Narrative   Not on file   Social Determinants of Health   Financial Resource Strain: Not on file  Food Insecurity: Not on file  Transportation Needs: Not on file  Physical Activity: Not on file  Stress: Not on file  Social Connections: Not on file     Review of Systems  Constitutional:  Negative for appetite change and unexpected weight change.  HENT:  Negative for congestion and sinus pressure.   Respiratory:  Negative for cough, chest tightness and shortness of breath.   Cardiovascular:  Negative for chest pain, palpitations and leg swelling.  Gastrointestinal:  Negative for abdominal pain, diarrhea, nausea and vomiting.  Genitourinary:  Negative for difficulty urinating and dysuria.  Musculoskeletal:  Negative for joint swelling and myalgias.  Skin:  Negative for color change and rash.  Neurological:  Negative for dizziness, light-headedness and headaches.  Psychiatric/Behavioral:  Negative for agitation and dysphoric mood.  Objective:     BP 128/78   Pulse 75   Temp 97.6 F (36.4 C)   Resp 16   Ht 5\' 7"  (1.702 m)   Wt 165 lb (74.8 kg)   SpO2 99%   BMI 25.84 kg/m  Wt Readings from Last 3 Encounters:  03/26/21 165 lb (74.8 kg)  10/04/20 162 lb (73.5 kg)  09/01/20 163 lb 9.6 oz (74.2 kg)    Physical Exam Vitals reviewed.  Constitutional:      General: She is not in acute distress.    Appearance: Normal appearance.  HENT:     Head: Normocephalic and atraumatic.     Right Ear: External ear normal.     Left Ear: External ear normal.  Eyes:     General: No scleral icterus.       Right eye: No discharge.        Left eye: No discharge.     Conjunctiva/sclera: Conjunctivae normal.  Neck:     Thyroid: No thyromegaly.  Cardiovascular:     Rate and Rhythm: Normal rate  and regular rhythm.  Pulmonary:     Effort: No respiratory distress.     Breath sounds: Normal breath sounds. No wheezing.  Abdominal:     General: Bowel sounds are normal.     Palpations: Abdomen is soft.     Tenderness: There is no abdominal tenderness.  Musculoskeletal:        General: No swelling or tenderness.     Cervical back: Neck supple. No tenderness.  Lymphadenopathy:     Cervical: No cervical adenopathy.  Skin:    Findings: No erythema or rash.  Neurological:     Mental Status: She is alert.  Psychiatric:        Mood and Affect: Mood normal.        Behavior: Behavior normal.     Outpatient Encounter Medications as of 03/26/2021  Medication Sig   amLODipine (NORVASC) 2.5 MG tablet Take 2.5 mg by mouth 2 (two) times daily.   Calcium Carbonate-Vitamin D (CALCIUM + D PO) Take by mouth daily at 8 pm.   doxycycline (PERIOSTAT) 20 MG tablet Take 20 mg by mouth 2 (two) times daily.    ezetimibe (ZETIA) 10 MG tablet Take 1 tablet (10 mg total) by mouth daily.   levothyroxine (SYNTHROID) 50 MCG tablet TAKE 1 TABLET BY MOUTH ONCE DAILY BEFOREBREAKFAST   losartan (COZAAR) 100 MG tablet Take 1 tablet (100 mg total) by mouth daily.   [DISCONTINUED] amLODipine (NORVASC) 5 MG tablet Take 5 mg by mouth daily.   Facility-Administered Encounter Medications as of 03/26/2021  Medication   0.9 %  sodium chloride infusion     Lab Results  Component Value Date   WBC 5.4 03/26/2021   HGB 13.3 03/26/2021   HCT 40.2 03/26/2021   PLT 340.0 03/26/2021   GLUCOSE 104 (H) 03/26/2021   CHOL 221 (H) 03/26/2021   TRIG 83.0 03/26/2021   HDL 82.90 03/26/2021   LDLCALC 121 (H) 03/26/2021   ALT 13 03/26/2021   AST 13 03/26/2021   NA 139 03/26/2021   K 4.3 03/26/2021   CL 106 03/26/2021   CREATININE 1.14 03/26/2021   BUN 24 (H) 03/26/2021   CO2 24 03/26/2021   TSH 3.85 03/26/2021       Assessment & Plan:   Problem List Items Addressed This Visit     History of colonic polyps     Colonoscopy 02/2017.  Dr Henrene Pastor  Hypercholesterolemia    On zetia. Intolerant to statin medication.  Low cholesterol diet and exercise.  Follow lipid panel.       Relevant Medications   amLODipine (NORVASC) 2.5 MG tablet   Other Relevant Orders   Hepatic function panel (Completed)   Lipid panel (Completed)   TSH (Completed)   Hypertension, essential - Primary    On losartan and amlodipine. Blood pressures as outlined.  Continue current medication regimen.  Follow pressures.  Follow metabolic panel.       Relevant Medications   amLODipine (NORVASC) 2.5 MG tablet   Other Relevant Orders   CBC with Differential/Platelet (Completed)   Basic metabolic panel (Completed)   Hypothyroid    On thyroid replacement.  Follow tsh.       Renal artery stenosis (HCC)    Evaluated by [facility] (Dr Bridgett Larsson).   Follow pressure and follow metabolic panel.       Relevant Medications   amLODipine (NORVASC) 2.5 MG tablet     Einar Pheasant, MD

## 2021-03-26 NOTE — Progress Notes (Signed)
Patient ID: Shelby Spencer, female   DOB: 1946/04/08, 75 y.o.   MRN: [id number]

## 2021-03-26 NOTE — Telephone Encounter (Signed)
Noted in chart.

## 2021-03-29 ENCOUNTER — Other Ambulatory Visit: Payer: Self-pay | Admitting: Internal Medicine

## 2021-03-29 DIAGNOSIS — E7800 Pure hypercholesterolemia, unspecified: Secondary | ICD-10-CM

## 2021-03-29 NOTE — Progress Notes (Signed)
Order placed for CCM referral.

## 2021-04-02 ENCOUNTER — Encounter: Payer: Self-pay | Admitting: Internal Medicine

## 2021-04-02 NOTE — Assessment & Plan Note (Signed)
Colonoscopy 02/2017.  Dr Perry 

## 2021-04-02 NOTE — Assessment & Plan Note (Signed)
On losartan and amlodipine. Blood pressures as outlined.  Continue current medication regimen.  Follow pressures.  Follow metabolic panel.  

## 2021-04-02 NOTE — Assessment & Plan Note (Signed)
Evaluated by [facility] (Dr Chen).   Follow pressure and follow metabolic panel.  

## 2021-04-02 NOTE — Assessment & Plan Note (Signed)
On zetia.  Intolerant to statin medication.  Low cholesterol diet and exercise.  Follow lipid panel.   

## 2021-04-02 NOTE — Assessment & Plan Note (Signed)
On thyroid replacement.  Follow tsh.  

## 2021-04-03 ENCOUNTER — Telehealth: Payer: Self-pay

## 2021-04-03 NOTE — Chronic Care Management (AMB) (Signed)
  Chronic Care Management   Note  04/03/2021 Name: Shelby Spencer MRN: [id number] DOB: March 10, 1946  Shelby Spencer is a 75 y.o. year old female who is a primary care patient of Einar Pheasant, MD. I reached out to [name] by phone today in response to a referral sent by [name] PCP.  [name] was given information about Chronic Care Management services today including:  CCM service includes personalized support from designated clinical staff supervised by her physician, including individualized plan of care and coordination with other care providers 24/7 contact phone numbers for assistance for urgent and routine care needs. Service will only be billed when office clinical staff spend 20 minutes or more in a month to coordinate care. Only one practitioner may furnish and bill the service in a calendar month. The patient may stop CCM services at any time (effective at the end of the month) by phone call to the office staff. The patient is responsible for co-pay (up to 20% after annual deductible is met) if co-pay is required by the individual health plan.   Patient agreed to services and verbal consent obtained.   Follow up plan: Telephone appointment with care management team member scheduled for:04/19/2021  Noreene Larsson, [place], [place], [place] [address] Direct Dial: [phone] [email or web address]: [email or web address]

## 2021-04-09 ENCOUNTER — Other Ambulatory Visit: Payer: Self-pay | Admitting: Internal Medicine

## 2021-04-19 ENCOUNTER — Ambulatory Visit (INDEPENDENT_AMBULATORY_CARE_PROVIDER_SITE_OTHER): Payer: Medicare Other | Admitting: Pharmacist

## 2021-04-19 ENCOUNTER — Other Ambulatory Visit: Payer: Self-pay | Admitting: Internal Medicine

## 2021-04-19 DIAGNOSIS — E7800 Pure hypercholesterolemia, unspecified: Secondary | ICD-10-CM

## 2021-04-19 DIAGNOSIS — I10 Essential (primary) hypertension: Secondary | ICD-10-CM

## 2021-04-19 NOTE — Chronic Care Management (AMB) (Signed)
Chronic Care Management CCM Pharmacy Note  04/19/2021 Name:  Shelby Spencer MRN:  [id number] DOB:  1946/03/03  Summary: - Uncontrolled lipids, history of statin intolerance  Recommendations/Changes made from today's visit: - Patient agreeable to start PCSK9i but prefers to wait until January. Will discuss at that time  Subjective: IREM Spencer is an 75 y.o. year old female who is a primary patient of Shelby Pheasant, MD.  The CCM team was consulted for assistance with disease management and care coordination needs.    Engaged with patient by telephone for initial visit for pharmacy case management and/or care coordination services.   Objective:  Medications Reviewed Today     Reviewed by De Hollingshead, RPH-CPP (Pharmacist) on 04/19/21 at 1548  Med List Status: <None>   Medication Order Taking? Sig Documenting Provider Last Dose Status Informant  0.9 %  sodium chloride infusion [id number]   Shelby Shipper, MD  Consider Medication Status and Discontinue (Completed Course)   amLODipine (NORVASC) 2.5 MG tablet [id number] Yes Take 2.5 mg by mouth 2 (two) times daily. [provider] Taking Active   Calcium Carbonate-Vitamin D (CALCIUM + D PO) [id number] Yes Take by mouth daily at 8 pm. [provider] Taking Active   doxycycline (PERIOSTAT) 20 MG tablet [id number] Yes Take 20 mg by mouth 2 (two) times daily.  [provider] Taking Active            Med Note Lia Hopping, MINDY L   Wed Aug 23, 2020  1:10 PM)    ezetimibe (ZETIA) 10 MG tablet [id number] Yes Take 1 tablet (10 mg total) by mouth daily. Shelby Pheasant, MD Taking Active   levothyroxine (SYNTHROID) 50 MCG tablet [id number] Yes TAKE 1 TABLET BY MOUTH ONCE DAILY Shelby Littler, MD Taking Active   losartan (COZAAR) 100 MG tablet [id number] Yes TAKE 1 TABLET BY MOUTH ONCE DAILY Shelby Pheasant, MD Taking Active             Pertinent Labs:   No results found for: HGBA1C Lab Results   Component Value Date   CHOL 221 (H) 03/26/2021   HDL 82.90 03/26/2021   LDLCALC 121 (H) 03/26/2021   TRIG 83.0 03/26/2021   CHOLHDL 3 03/26/2021   Lab Results  Component Value Date   CREATININE 1.14 03/26/2021   BUN 24 (H) 03/26/2021   NA 139 03/26/2021   K 4.3 03/26/2021   CL 106 03/26/2021   CO2 24 03/26/2021    SDOH:  (Social Determinants of Health) assessments and interventions performed:  SDOH Interventions    Flowsheet Row Most Recent Value  SDOH Interventions   Financial Strain Interventions Intervention Not Indicated       CCM Care Plan  Review of patient past medical history, allergies, medications, health status, including review of consultants reports, laboratory and other test data, was performed as part of comprehensive evaluation and provision of chronic care management services.   Care Plan : Medication Management  Updates made by De Hollingshead, RPH-CPP since 04/19/2021 12:00 AM     Problem: Hyperlipidemia, Hypertension      Long-Range Goal: Disease Progression Prevention   Start Date: 04/19/2021  This Visit's Progress: On track  Priority: High  Note:   Current Barriers:  Unable to achieve control of hyperlipidemia   Pharmacist Clinical Goal(s):  patient will achieve control of lipids through collaboration with PharmD and provider.   Interventions: 1:1 collaboration with Shelby Pheasant, MD regarding development and update of comprehensive plan  of care as evidenced by provider attestation and co-signature Inter-disciplinary care team collaboration (see longitudinal plan of care) Comprehensive medication review performed; medication list updated in electronic medical record  Hyperlipidemia:   Uncontrolled; current treatment: ezetimibe 10 mg daily  Medications previously tried: pravastatin 10 mg daily, rosuvastatin 5 mg daily  - both caused significant bilateral muscle aches  Extensive discussion about PCSK9i. Discussed side effects,  administration, access and copay. Discussed [place]. Patient elects to defer starting medication until January with the start of a new insurance plan.   Hypertension:   Controlled; current treatment: amlodipine 2.5 mg BID, losartan 100 mg daily Recommended to continue current regimen at this time   Patient Goals/Self-Care Activities patient will:  - collaborate with provider on medication access solutions       Plan: Telephone follow up appointment with care management team member scheduled for:  8 weeks  Shelby Spencer, PharmD, [place], [place] Pharmacist [employer] at [employer] [phone]

## 2021-04-19 NOTE — Patient Instructions (Signed)
Shelby Spencer,   It was great talking to you today!  In January, let's plan to pursue insurance coverage for [place] or Praluent. These are evert 14 day injections that work to increase your body's clearance of LDL, or bad cholesterol.   There is a group called the [employer] that can provide grant funding assistance for patients on cholesterol medications. https://www.healthwellfoundation.org/fund/hypercholesterolemia-medicare-access/  Take care!   Visit Information   PATIENT GOALS/PLAN OF CARE:  Care Plan : Medication Management  Updates made by De Hollingshead, RPH-CPP since 04/19/2021 12:00 AM     Problem: Hyperlipidemia, Hypertension      Long-Range Goal: Disease Progression Prevention   Start Date: 04/19/2021  This Visit's Progress: On track  Priority: High  Note:   Current Barriers:  Unable to achieve control of hyperlipidemia   Pharmacist Clinical Goal(s):  patient will achieve control of lipids through collaboration with PharmD and provider.   Interventions: 1:1 collaboration with Einar Pheasant, MD regarding development and update of comprehensive plan of care as evidenced by provider attestation and co-signature Inter-disciplinary care team collaboration (see longitudinal plan of care) Comprehensive medication review performed; medication list updated in electronic medical record  Hyperlipidemia:   Uncontrolled; current treatment: ezetimibe 10 mg daily  Medications previously tried: pravastatin 10 mg daily, rosuvastatin 5 mg daily  - both caused significant bilateral muscle aches  Extensive discussion about PCSK9i. Discussed side effects, administration, access and copay. Discussed [place]. Patient elects to defer starting medication until January with the start of a new insurance plan.   Hypertension:   Controlled; current treatment: amlodipine 2.5 mg BID, losartan 100 mg daily Recommended to continue current regimen at this  time   Patient Goals/Self-Care Activities patient will:  - collaborate with provider on medication access solutions      Consent to CCM Services: Ms. Divelbiss was given information about Chronic Care Management services including:  CCM service includes personalized support from designated clinical staff supervised by her physician, including individualized plan of care and coordination with other care providers 24/7 contact phone numbers for assistance for urgent and routine care needs. Service will only be billed when office clinical staff spend 20 minutes or more in a month to coordinate care. Only one practitioner may furnish and bill the service in a calendar month. The patient may stop CCM services at any time (effective at the end of the month) by phone call to the office staff. The patient will be responsible for cost sharing (co-pay) of up to 20% of the service fee (after annual deductible is met).  Patient agreed to services and verbal consent obtained.   Patient verbalizes understanding of instructions provided today and agrees to view in [place].   Plan: Telephone follow up appointment with care management team member scheduled for:  8 weeks  [name], PharmD, [place], [place] Clinical Pharmacist [employer] at [employer] [phone]

## 2021-05-02 DIAGNOSIS — E7800 Pure hypercholesterolemia, unspecified: Secondary | ICD-10-CM | POA: Diagnosis not present

## 2021-05-02 DIAGNOSIS — I10 Essential (primary) hypertension: Secondary | ICD-10-CM

## 2021-06-11 ENCOUNTER — Ambulatory Visit: Payer: Medicare Other | Admitting: Pharmacist

## 2021-06-11 DIAGNOSIS — E039 Hypothyroidism, unspecified: Secondary | ICD-10-CM

## 2021-06-11 DIAGNOSIS — I10 Essential (primary) hypertension: Secondary | ICD-10-CM

## 2021-06-11 DIAGNOSIS — E7800 Pure hypercholesterolemia, unspecified: Secondary | ICD-10-CM

## 2021-06-11 NOTE — Patient Instructions (Signed)
Visit Information  Following are the goals we discussed today:  Patient Goals/Self-Care Activities patient will:  - collaborate with provider on medication access solutions          Plan: Telephone follow up appointment with care management team member scheduled for:  pending medication needs   [name], PharmD, [place], CPP Clinical Pharmacist [place] at [facility] [phone]     Please call the care guide team at [phone] if you need to cancel or reschedule your appointment.   Patient verbalizes understanding of instructions provided today and agrees to view in [place].

## 2021-06-11 NOTE — Chronic Care Management (AMB) (Signed)
Chronic Care Management CCM Pharmacy Note  06/11/2021 Name:  Shelby Spencer MRN:  [id number] DOB:  01-Nov-1945  Summary: - Patient declines to start PCSK9i today, prefers to work on diet/exercise  Recommendations/Changes made from today's visit: - Follow up with PCP in 6 weeks as scheduled  Subjective: MAIZEY MENENDEZ is an 76 y.o. year old female who is a primary patient of Einar Pheasant, MD.  The CCM team was consulted for assistance with disease management and care coordination needs.    Engaged with patient by telephone for follow up for pharmacy case management and/or care coordination services.   Objective:  Medications Reviewed Today     Reviewed by De Hollingshead, RPH-CPP (Pharmacist) on 04/19/21 at 1548  Med List Status: <None>   Medication Order Taking? Sig Documenting Provider Last Dose Status Informant  0.9 %  sodium chloride infusion [id number]   Irene Shipper, MD  Consider Medication Status and Discontinue (Completed Course)   amLODipine (NORVASC) 2.5 MG tablet [id number] Yes Take 2.5 mg by mouth 2 (two) times daily. [provider] Taking Active   Calcium Carbonate-Vitamin D (CALCIUM + D PO) [id number] Yes Take by mouth daily at 8 pm. [provider] Taking Active   doxycycline (PERIOSTAT) 20 MG tablet [id number] Yes Take 20 mg by mouth 2 (two) times daily.  [provider] Taking Active            Med Note Lia Hopping, MINDY L   Wed Aug 23, 2020  1:10 PM)    ezetimibe (ZETIA) 10 MG tablet [id number] Yes Take 1 tablet (10 mg total) by mouth daily. Einar Pheasant, MD Taking Active   levothyroxine (SYNTHROID) 50 MCG tablet [id number] Yes TAKE 1 TABLET BY MOUTH ONCE DAILY Carolann Littler, MD Taking Active   losartan (COZAAR) 100 MG tablet [id number] Yes TAKE 1 TABLET BY MOUTH ONCE DAILY Einar Pheasant, MD Taking Active             Pertinent Labs:   No results found for: HGBA1C Lab Results  Component Value Date   CHOL 221 (H)  03/26/2021   HDL 82.90 03/26/2021   LDLCALC 121 (H) 03/26/2021   TRIG 83.0 03/26/2021   CHOLHDL 3 03/26/2021   Lab Results  Component Value Date   CREATININE 1.14 03/26/2021   BUN 24 (H) 03/26/2021   NA 139 03/26/2021   K 4.3 03/26/2021   CL 106 03/26/2021   CO2 24 03/26/2021    SDOH:  (Social Determinants of Health) assessments and interventions performed:  SDOH Interventions    Flowsheet Row Most Recent Value  SDOH Interventions   Financial Strain Interventions Intervention Not Indicated       CCM Care Plan  Review of patient past medical history, allergies, medications, health status, including review of consultants reports, laboratory and other test data, was performed as part of comprehensive evaluation and provision of chronic care management services.   Care Plan : Medication Management  Updates made by De Hollingshead, RPH-CPP since 06/11/2021 12:00 AM     Problem: Hyperlipidemia, Hypertension      Long-Range Goal: Disease Progression Prevention   Start Date: 04/19/2021  Recent Progress: On track  Priority: High  Note:   Current Barriers:  Unable to achieve control of hyperlipidemia   Pharmacist Clinical Goal(s):  patient will achieve control of lipids through collaboration with PharmD and provider.   Interventions: 1:1 collaboration with Einar Pheasant, MD regarding development and update of comprehensive plan of care as  evidenced by provider attestation and co-signature Inter-disciplinary care team collaboration (see longitudinal plan of care) Comprehensive medication review performed; medication list updated in electronic medical record  Health Maintenance   Yearly diabetic eye exam: up to date Yearly diabetic foot exam: up to date Urine microalbumin: up to date Yearly influenza vaccination: up to date Td/Tdap vaccination: up to date Pneumonia vaccination: up to date COVID vaccinations: up to date Shingrix vaccinations: up to  date Colonoscopy: up to date Bone density scan: up to date Mammogram: up to date  Hyperlipidemia:   Uncontrolled; current treatment: ezetimibe 10 mg daily  Medications previously tried: pravastatin 10 mg daily, rosuvastatin 5 mg daily  - both caused significant bilateral muscle aches  Called patient to discuss PCKS9i. She reports she is not interested in starting medication today. She and her husband plan to work on diet and exercise prior to next PCP appointment and follow impact on hyperlipidemia. If next LDL is not at goal <100, recommend initiation of PCSK9i. Discussed [employer] and options for access.   Hypertension:   Controlled; current treatment: amlodipine 2.5 mg BID, losartan 100 mg daily Previously recommended to continue current regimen at this time   Patient Goals/Self-Care Activities patient will:  - collaborate with provider on medication access solutions       Plan: Telephone follow up appointment with care management team member scheduled for:  pending medication needs  [name], PharmD, [place], [place] Pharmacist [employer] at [employer] [phone]

## 2021-06-12 ENCOUNTER — Telehealth: Payer: No Typology Code available for payment source

## 2021-06-28 DIAGNOSIS — L718 Other rosacea: Secondary | ICD-10-CM | POA: Diagnosis not present

## 2021-06-28 DIAGNOSIS — R202 Paresthesia of skin: Secondary | ICD-10-CM | POA: Diagnosis not present

## 2021-06-28 DIAGNOSIS — Z1231 Encounter for screening mammogram for malignant neoplasm of breast: Secondary | ICD-10-CM | POA: Diagnosis not present

## 2021-07-03 ENCOUNTER — Other Ambulatory Visit: Payer: Self-pay | Admitting: Internal Medicine

## 2021-07-17 NOTE — Progress Notes (Signed)
Changed orders to future

## 2021-07-17 NOTE — Addendum Note (Signed)
Addended by: Leeanne Rio on: 07/17/2021 11:35 AM   Modules accepted: Orders

## 2021-07-19 DIAGNOSIS — R928 Other abnormal and inconclusive findings on diagnostic imaging of breast: Secondary | ICD-10-CM | POA: Diagnosis not present

## 2021-07-19 DIAGNOSIS — R922 Inconclusive mammogram: Secondary | ICD-10-CM | POA: Diagnosis not present

## 2021-07-22 ENCOUNTER — Telehealth: Payer: Self-pay | Admitting: Internal Medicine

## 2021-07-22 NOTE — Telephone Encounter (Signed)
I received notification from [facility] - had her mammogram 06/28/21,  recommended additional views of right breast.  I have not received these.  Has she had them done?  If so, need results.  If not, need to schedule.

## 2021-07-23 NOTE — Telephone Encounter (Signed)
Notify Ms Chiao that I received and reviewed f/u views and radiology felt ok.  Recommended f/u mammogram in one year.  Let us know if any problems.

## 2021-07-23 NOTE — Telephone Encounter (Signed)
Follow up views received and given to Dr Nicki Reaper for review

## 2021-07-23 NOTE — Telephone Encounter (Signed)
Patient aware of below.

## 2021-07-27 ENCOUNTER — Other Ambulatory Visit: Payer: No Typology Code available for payment source

## 2021-07-30 ENCOUNTER — Ambulatory Visit: Payer: Medicare Other | Admitting: Internal Medicine

## 2021-08-02 ENCOUNTER — Other Ambulatory Visit (INDEPENDENT_AMBULATORY_CARE_PROVIDER_SITE_OTHER): Payer: Medicare HMO

## 2021-08-02 ENCOUNTER — Other Ambulatory Visit: Payer: Self-pay

## 2021-08-02 DIAGNOSIS — E039 Hypothyroidism, unspecified: Secondary | ICD-10-CM | POA: Diagnosis not present

## 2021-08-02 DIAGNOSIS — I10 Essential (primary) hypertension: Secondary | ICD-10-CM | POA: Diagnosis not present

## 2021-08-02 DIAGNOSIS — E7800 Pure hypercholesterolemia, unspecified: Secondary | ICD-10-CM

## 2021-08-02 LAB — COMPREHENSIVE METABOLIC PANEL
ALT: 11 U/L (ref 0–35)
AST: 14 U/L (ref 0–37)
Albumin: 4.5 g/dL (ref 3.5–5.2)
Alkaline Phosphatase: 68 U/L (ref 39–117)
BUN: 24 mg/dL — ABNORMAL HIGH (ref 6–23)
CO2: 27 mEq/L (ref 19–32)
Calcium: 9.8 mg/dL (ref 8.4–10.5)
Chloride: 102 mEq/L (ref 96–112)
Creatinine, Ser: 1.24 mg/dL — ABNORMAL HIGH (ref 0.40–1.20)
GFR: 42.83 mL/min — ABNORMAL LOW (ref >60.00)
Glucose, Bld: 91 mg/dL (ref 70–99)
Potassium: 4.6 mEq/L (ref 3.5–5.1)
Sodium: 137 mEq/L (ref 135–145)
Total Bilirubin: 1.1 mg/dL (ref 0.2–1.2)
Total Protein: 6.8 g/dL (ref 6.0–8.3)

## 2021-08-02 LAB — LIPID PANEL
Cholesterol: 250 mg/dL — ABNORMAL HIGH (ref 0–200)
HDL: 89.4 mg/dL (ref >39.00)
LDL Cholesterol: 143 mg/dL — ABNORMAL HIGH (ref 0–99)
NonHDL: 160.96
Total CHOL/HDL Ratio: 3
Triglycerides: 88 mg/dL (ref 0.0–149.0)
VLDL: 17.6 mg/dL (ref 0.0–40.0)

## 2021-08-02 LAB — TSH: TSH: 4.09 u[IU]/mL (ref 0.35–5.50)

## 2021-08-06 ENCOUNTER — Ambulatory Visit (INDEPENDENT_AMBULATORY_CARE_PROVIDER_SITE_OTHER): Payer: Medicare HMO | Admitting: Internal Medicine

## 2021-08-06 ENCOUNTER — Other Ambulatory Visit: Payer: Self-pay

## 2021-08-06 ENCOUNTER — Other Ambulatory Visit: Payer: Self-pay | Admitting: Internal Medicine

## 2021-08-06 DIAGNOSIS — E039 Hypothyroidism, unspecified: Secondary | ICD-10-CM

## 2021-08-06 DIAGNOSIS — Z86010 Personal history of colonic polyps: Secondary | ICD-10-CM

## 2021-08-06 DIAGNOSIS — I10 Essential (primary) hypertension: Secondary | ICD-10-CM

## 2021-08-06 DIAGNOSIS — I701 Atherosclerosis of renal artery: Secondary | ICD-10-CM | POA: Diagnosis not present

## 2021-08-06 DIAGNOSIS — E7800 Pure hypercholesterolemia, unspecified: Secondary | ICD-10-CM

## 2021-08-06 MED ORDER — ATORVASTATIN CALCIUM 10 MG PO TABS: ORAL_TABLET | ORAL | 1 refills | Status: DC

## 2021-08-06 NOTE — Assessment & Plan Note (Signed)
On zetia. Intolerant to statin medication.  Low cholesterol diet and exercise.  Follow lipid panel. The 10-year ASCVD risk score (Arnett DK, et al., 2019) is: 19.4%   Values used to calculate the score:     Age: 76 years     Sex: Female     Is Non-Hispanic African American: No     Diabetic: No     Tobacco smoker: No     Systolic Blood Pressure: 641 mmHg     Is BP treated: Yes     HDL Cholesterol: 89.4 mg/dL     Total Cholesterol: 250 mg/dL

## 2021-08-06 NOTE — Progress Notes (Unsigned)
Patient ID: Shelby Spencer, female   DOB: [birth year], 76 y.o.   MRN: [id number]   Subjective:    Patient ID: ALEANNA MENGE, female    DOB: Feb 08, 1946, 76 y.o.   MRN: [id number]  This visit occurred during the SARS-CoV-2 public health emergency.  Safety protocols were in place, including screening questions prior to the visit, additional usage of staff PPE, and extensive cleaning of exam room while observing appropriate contact time as indicated for disinfecting solutions.   Patient here for a scheduled follow up.   Chief Complaint  Patient presents with   Hypothyroidism   Hypertension   Hyperlipidemia   .   HPI Here to follow up regarding her blood pressure, cholesterol and kidney function.  She reports she is doing relatively well.  Getting back in the gym.  No chest pain or sob reported.  No cough or congestion.  No acid reflux or abdominal pain reported.  Bowels moving.  Has tries crestor and pravastatin - intolerance.  On zetia now.  Cholesterol elevated.  Discussed repatha.  Also discussed a trial of another statin - two days per week - agreeable.     Past Medical History:  Diagnosis Date   Cataract    CKD (chronic kidney disease), stage III ([place])    History of colon polyps    History of prolapse of bladder    History of pyloric stenosis    Hypercholesterolemia    Hypertension    Thyroid disease    Past Surgical History:  Procedure Laterality Date   CATARACT EXTRACTION, BILATERAL     TONSILLECTOMY     TUBAL LIGATION     Family History  Problem Relation Age of Onset   Hypertension Mother    Heart disease Father    Heart attack Maternal Grandfather    Colon cancer Neg Hx    Esophageal cancer Neg Hx    Pancreatic cancer Neg Hx    Rectal cancer Neg Hx    Stomach cancer Neg Hx    Social History   Socioeconomic History   Marital status: Married    Spouse name: Not on file   Number of children: Not on file   Years of education: Not on file   Highest education level:  Not on file  Occupational History   Not on file  Tobacco Use   Smoking status: Never   Smokeless tobacco: Never  Vaping Use   Vaping Use: Never used  Substance and Sexual Activity   Alcohol use: Yes    Alcohol/week: 5.0 standard drinks    Types: 5 Glasses of wine per week    Comment: per week   Drug use: No   Sexual activity: Not Currently    Partners: Male    Comment: 1st intercourse-21, partners- 2, married- 49.5 yrs  Other Topics Concern   Not on file  Social History Narrative   Not on file   Social Determinants of Health   Financial Resource Strain: Low Risk    Difficulty of Paying Living Expenses: Not hard at all  Food Insecurity: Not on file  Transportation Needs: Not on file  Physical Activity: Not on file  Stress: Not on file  Social Connections: Not on file     Review of Systems  Constitutional:  Negative for appetite change and unexpected weight change.  HENT:  Negative for congestion and sinus pressure.   Respiratory:  Negative for cough, chest tightness and shortness of breath.   Cardiovascular:  Negative for  chest pain, palpitations and leg swelling.  Gastrointestinal:  Negative for abdominal pain, diarrhea, nausea and vomiting.  Genitourinary:  Negative for difficulty urinating and dysuria.  Musculoskeletal:  Negative for joint swelling and myalgias.  Skin:  Negative for color change and rash.  Neurological:  Negative for dizziness, light-headedness and headaches.  Psychiatric/Behavioral:  Negative for agitation and dysphoric mood.       Objective:     BP 126/70    Pulse 70    Temp 97.9 F (36.6 C)    Resp 16    Ht '5\' 8"'$  (1.727 m)    Wt 164 lb 3.2 oz (74.5 kg)    SpO2 98%    BMI 24.97 kg/m  Wt Readings from Last 3 Encounters:  08/06/21 164 lb 3.2 oz (74.5 kg)  03/26/21 165 lb (74.8 kg)  10/04/20 162 lb (73.5 kg)    Physical Exam Vitals reviewed.  Constitutional:      General: She is not in acute distress.    Appearance: Normal appearance.   HENT:     Head: Normocephalic and atraumatic.     Right Ear: External ear normal.     Left Ear: External ear normal.  Eyes:     General: No scleral icterus.       Right eye: No discharge.        Left eye: No discharge.     Conjunctiva/sclera: Conjunctivae normal.  Neck:     Thyroid: No thyromegaly.  Cardiovascular:     Rate and Rhythm: Normal rate and regular rhythm.  Pulmonary:     Effort: No respiratory distress.     Breath sounds: Normal breath sounds. No wheezing.  Abdominal:     General: Bowel sounds are normal.     Palpations: Abdomen is soft.     Tenderness: There is no abdominal tenderness.  Musculoskeletal:        General: No swelling or tenderness.     Cervical back: Neck supple. No tenderness.  Lymphadenopathy:     Cervical: No cervical adenopathy.  Skin:    Findings: No erythema or rash.  Neurological:     Mental Status: She is alert.  Psychiatric:        Mood and Affect: Mood normal.        Behavior: Behavior normal.     Outpatient Encounter Medications as of 08/06/2021  Medication Sig   atorvastatin (LIPITOR) 10 MG tablet Take one tablet two days per week.   amLODipine (NORVASC) 2.5 MG tablet Take 2.5 mg by mouth 2 (two) times daily.   Calcium Carbonate-Vitamin D (CALCIUM + D PO) Take by mouth daily at 8 pm.   doxycycline (PERIOSTAT) 20 MG tablet Take 20 mg by mouth 2 (two) times daily.    ezetimibe (ZETIA) 10 MG tablet TAKE 1 TABLET BY MOUTH ONCE DAILY   levothyroxine (SYNTHROID) 50 MCG tablet TAKE 1 TABLET BY MOUTH ONCE DAILY BEFOREBREAKFAST   losartan (COZAAR) 100 MG tablet TAKE 1 TABLET BY MOUTH ONCE DAILY   Facility-Administered Encounter Medications as of 08/06/2021  Medication   0.9 %  sodium chloride infusion     Lab Results  Component Value Date   WBC 5.4 03/26/2021   HGB 13.3 03/26/2021   HCT 40.2 03/26/2021   PLT 340.0 03/26/2021   GLUCOSE 91 08/02/2021   CHOL 250 (H) 08/02/2021   TRIG 88.0 08/02/2021   HDL 89.40 08/02/2021   LDLCALC  143 (H) 08/02/2021   ALT 11 08/02/2021   AST 14 08/02/2021  NA 137 08/02/2021   K 4.6 08/02/2021   CL 102 08/02/2021   CREATININE 1.24 (H) 08/02/2021   BUN 24 (H) 08/02/2021   CO2 27 08/02/2021   TSH 4.09 08/02/2021      Assessment & Plan:   Problem List Items Addressed This Visit     Hypercholesterolemia    On zetia. Intolerant to statin medication.  Low cholesterol diet and exercise.  Follow lipid panel. The 10-year ASCVD risk score (Arnett DK, et al., 2019) is: 19.4%   Values used to calculate the score:     Age: 74 years     Sex: Female     Is Non-Hispanic African American: No     Diabetic: No     Tobacco smoker: No     Systolic Blood Pressure: 433 mmHg     Is BP treated: Yes     HDL Cholesterol: 89.4 mg/dL     Total Cholesterol: 250 mg/dL      Relevant Medications   atorvastatin (LIPITOR) 10 MG tablet     Einar Pheasant, MD

## 2021-08-09 ENCOUNTER — Ambulatory Visit: Payer: Self-pay | Admitting: Pharmacist

## 2021-08-09 NOTE — Chronic Care Management (AMB) (Signed)
?  Chronic Care Management  ? ?Note ? ?08/09/2021 ?Name: Shelby Spencer MRN: [id number] DOB: 1945-10-23 ? ? ? ?Closing pharmacy CCM case at this time.Patient has clinic contact information for future questions or concerns.  ? ?[name], PharmD, [place], CPP ?Clinical Pharmacist ?Therapist, music at [employer] ?[phone] ? ?

## 2021-08-14 ENCOUNTER — Encounter: Payer: Self-pay | Admitting: Internal Medicine

## 2021-08-14 NOTE — Assessment & Plan Note (Signed)
Colonoscopy 02/2017.  Dr Perry 

## 2021-08-14 NOTE — Assessment & Plan Note (Signed)
On thyroid replacement.  Follow tsh.  

## 2021-08-14 NOTE — Assessment & Plan Note (Addendum)
Evaluated by [facility] (Dr Bridgett Larsson).   Follow pressure and follow metabolic panel. Discussed consideration of renal ultrasound - to evaluate kidneys.   ?

## 2021-08-14 NOTE — Assessment & Plan Note (Signed)
On losartan and amlodipine. Blood pressures as outlined.  Continue current medication regimen.  Follow pressures.  Follow metabolic panel.  

## 2021-09-17 ENCOUNTER — Other Ambulatory Visit (INDEPENDENT_AMBULATORY_CARE_PROVIDER_SITE_OTHER): Payer: Medicare HMO

## 2021-09-17 DIAGNOSIS — E7800 Pure hypercholesterolemia, unspecified: Secondary | ICD-10-CM | POA: Diagnosis not present

## 2021-09-17 LAB — BASIC METABOLIC PANEL
BUN: 21 mg/dL (ref 6–23)
CO2: 28 mEq/L (ref 19–32)
Calcium: 9.7 mg/dL (ref 8.4–10.5)
Chloride: 105 mEq/L (ref 96–112)
Creatinine, Ser: 1.15 mg/dL (ref 0.40–1.20)
GFR: 46.84 mL/min — ABNORMAL LOW (ref >60.00)
Glucose, Bld: 95 mg/dL (ref 70–99)
Potassium: 4.3 mEq/L (ref 3.5–5.1)
Sodium: 139 mEq/L (ref 135–145)

## 2021-09-17 LAB — HEPATIC FUNCTION PANEL
ALT: 12 U/L (ref 0–35)
AST: 12 U/L (ref 0–37)
Albumin: 4.5 g/dL (ref 3.5–5.2)
Alkaline Phosphatase: 71 U/L (ref 39–117)
Bilirubin, Direct: 0.2 mg/dL (ref 0.0–0.3)
Total Bilirubin: 0.8 mg/dL (ref 0.2–1.2)
Total Protein: 6.9 g/dL (ref 6.0–8.3)

## 2021-09-20 ENCOUNTER — Other Ambulatory Visit: Payer: Self-pay

## 2021-09-20 ENCOUNTER — Telehealth: Payer: Self-pay | Admitting: Internal Medicine

## 2021-09-20 MED ORDER — AMLODIPINE BESYLATE 2.5 MG PO TABS: 2.5000 mg | ORAL_TABLET | Freq: Two times a day (BID) | ORAL | 1 refills | Status: DC

## 2021-09-20 NOTE — Telephone Encounter (Signed)
sent 

## 2021-09-20 NOTE — Telephone Encounter (Signed)
Refill on amLODipine (NORVASC) 2.5 MG tablet ?

## 2021-10-10 ENCOUNTER — Ambulatory Visit: Payer: Medicare Other | Admitting: Obstetrics & Gynecology

## 2021-10-17 ENCOUNTER — Other Ambulatory Visit: Payer: Self-pay | Admitting: Internal Medicine

## 2021-10-22 DIAGNOSIS — M85852 Other specified disorders of bone density and structure, left thigh: Secondary | ICD-10-CM | POA: Diagnosis not present

## 2021-10-22 DIAGNOSIS — M85851 Other specified disorders of bone density and structure, right thigh: Secondary | ICD-10-CM | POA: Diagnosis not present

## 2021-10-22 DIAGNOSIS — Z780 Asymptomatic menopausal state: Secondary | ICD-10-CM | POA: Diagnosis not present

## 2021-10-22 LAB — HM DEXA SCAN

## 2021-10-23 ENCOUNTER — Other Ambulatory Visit: Payer: Self-pay | Admitting: Internal Medicine

## 2021-10-31 ENCOUNTER — Telehealth: Payer: Self-pay | Admitting: Internal Medicine

## 2021-10-31 NOTE — Telephone Encounter (Signed)
She may already be aware, but notify pt that her bone density reveals osteopenia.  This means she has had some bone loss, but does not meet criteria for osteoporosis.  Continue calcium, vitamin D and weight bearing exercise.

## 2021-11-01 NOTE — Telephone Encounter (Signed)
Pt advised - agrees to calcium, vit d and exercise

## 2021-11-05 ENCOUNTER — Other Ambulatory Visit: Payer: Self-pay | Admitting: Family

## 2021-11-26 ENCOUNTER — Ambulatory Visit: Payer: Medicare Other | Admitting: Obstetrics & Gynecology

## 2021-12-06 ENCOUNTER — Other Ambulatory Visit: Payer: Medicare HMO

## 2021-12-12 ENCOUNTER — Ambulatory Visit: Payer: Medicare HMO | Admitting: Internal Medicine

## 2021-12-12 ENCOUNTER — Other Ambulatory Visit: Payer: Self-pay

## 2021-12-12 ENCOUNTER — Telehealth: Payer: Self-pay | Admitting: Internal Medicine

## 2021-12-12 DIAGNOSIS — I10 Essential (primary) hypertension: Secondary | ICD-10-CM

## 2021-12-12 DIAGNOSIS — E039 Hypothyroidism, unspecified: Secondary | ICD-10-CM

## 2021-12-12 DIAGNOSIS — E7800 Pure hypercholesterolemia, unspecified: Secondary | ICD-10-CM

## 2021-12-12 NOTE — Telephone Encounter (Signed)
Patient has a lab appt 12/17/2021, there are no orders in.

## 2021-12-12 NOTE — Telephone Encounter (Signed)
Orders placed.

## 2021-12-17 ENCOUNTER — Other Ambulatory Visit (INDEPENDENT_AMBULATORY_CARE_PROVIDER_SITE_OTHER): Payer: Medicare HMO

## 2021-12-17 DIAGNOSIS — I10 Essential (primary) hypertension: Secondary | ICD-10-CM | POA: Diagnosis not present

## 2021-12-17 DIAGNOSIS — E039 Hypothyroidism, unspecified: Secondary | ICD-10-CM | POA: Diagnosis not present

## 2021-12-17 DIAGNOSIS — E7800 Pure hypercholesterolemia, unspecified: Secondary | ICD-10-CM | POA: Diagnosis not present

## 2021-12-17 LAB — LIPID PANEL
Cholesterol: 181 mg/dL (ref 0–200)
HDL: 74.2 mg/dL (ref >39.00)
LDL Cholesterol: 86 mg/dL (ref 0–99)
NonHDL: 106.44
Total CHOL/HDL Ratio: 2
Triglycerides: 100 mg/dL (ref 0.0–149.0)
VLDL: 20 mg/dL (ref 0.0–40.0)

## 2021-12-17 LAB — BASIC METABOLIC PANEL
BUN: 24 mg/dL — ABNORMAL HIGH (ref 6–23)
CO2: 25 mEq/L (ref 19–32)
Calcium: 9.3 mg/dL (ref 8.4–10.5)
Chloride: 105 mEq/L (ref 96–112)
Creatinine, Ser: 1.04 mg/dL (ref 0.40–1.20)
GFR: 52.75 mL/min — ABNORMAL LOW (ref >60.00)
Glucose, Bld: 96 mg/dL (ref 70–99)
Potassium: 4.4 mEq/L (ref 3.5–5.1)
Sodium: 140 mEq/L (ref 135–145)

## 2021-12-17 LAB — HEPATIC FUNCTION PANEL
ALT: 10 U/L (ref 0–35)
AST: 10 U/L (ref 0–37)
Albumin: 4.4 g/dL (ref 3.5–5.2)
Alkaline Phosphatase: 57 U/L (ref 39–117)
Bilirubin, Direct: 0.1 mg/dL (ref 0.0–0.3)
Total Bilirubin: 0.7 mg/dL (ref 0.2–1.2)
Total Protein: 6.4 g/dL (ref 6.0–8.3)

## 2021-12-17 LAB — TSH: TSH: 2.25 u[IU]/mL (ref 0.35–5.50)

## 2021-12-20 ENCOUNTER — Encounter: Payer: Self-pay | Admitting: Internal Medicine

## 2021-12-20 ENCOUNTER — Ambulatory Visit: Payer: Medicare HMO | Admitting: Internal Medicine

## 2021-12-20 VITALS — BP 128/74 | HR 72 | Temp 97.7°F | Resp 15 | Ht 68.0 in | Wt 162.4 lb

## 2021-12-20 DIAGNOSIS — E039 Hypothyroidism, unspecified: Secondary | ICD-10-CM | POA: Diagnosis not present

## 2021-12-20 DIAGNOSIS — I701 Atherosclerosis of renal artery: Secondary | ICD-10-CM | POA: Diagnosis not present

## 2021-12-20 DIAGNOSIS — I10 Essential (primary) hypertension: Secondary | ICD-10-CM

## 2021-12-20 DIAGNOSIS — R413 Other amnesia: Secondary | ICD-10-CM | POA: Insufficient documentation

## 2021-12-20 DIAGNOSIS — Z860100 Personal history of colon polyps, unspecified: Secondary | ICD-10-CM

## 2021-12-20 DIAGNOSIS — E7800 Pure hypercholesterolemia, unspecified: Secondary | ICD-10-CM | POA: Diagnosis not present

## 2021-12-20 LAB — CBC WITH DIFFERENTIAL/PLATELET
Basophils Absolute: 0 10*3/uL (ref 0.0–0.1)
Basophils Relative: 0.6 % (ref 0.0–3.0)
Eosinophils Absolute: 0.2 10*3/uL (ref 0.0–0.7)
Eosinophils Relative: 2.5 % (ref 0.0–5.0)
HCT: 40.5 % (ref 36.0–46.0)
Hemoglobin: 13.5 g/dL (ref 12.0–15.0)
Lymphocytes Relative: 25.2 % (ref 12.0–46.0)
Lymphs Abs: 1.6 10*3/uL (ref 0.7–4.0)
MCHC: 33.4 g/dL (ref 30.0–36.0)
MCV: 91.4 fl (ref 78.0–100.0)
Monocytes Absolute: 0.5 10*3/uL (ref 0.1–1.0)
Monocytes Relative: 8.3 % (ref 3.0–12.0)
Neutro Abs: 4.1 10*3/uL (ref 1.4–7.7)
Neutrophils Relative %: 63.4 % (ref 43.0–77.0)
Platelets: 331 10*3/uL (ref 150.0–400.0)
RBC: 4.44 Mil/uL (ref 3.87–5.11)
RDW: 13.3 % (ref 11.5–15.5)
WBC: 6.4 10*3/uL (ref 4.0–10.5)

## 2021-12-20 LAB — VITAMIN B12: Vitamin B-12: 238 pg/mL (ref 211–911)

## 2021-12-20 LAB — TSH: TSH: 2.5 u[IU]/mL (ref 0.35–5.50)

## 2021-12-20 MED ORDER — ATORVASTATIN CALCIUM 10 MG PO TABS: ORAL_TABLET | ORAL | 1 refills | Status: DC

## 2021-12-20 MED ORDER — AMLODIPINE BESYLATE 2.5 MG PO TABS: 2.5000 mg | ORAL_TABLET | Freq: Two times a day (BID) | ORAL | 1 refills | Status: DC

## 2021-12-20 MED ORDER — LOSARTAN POTASSIUM 100 MG PO TABS: 100.0000 mg | ORAL_TABLET | Freq: Every day | ORAL | 1 refills | Status: DC

## 2021-12-20 MED ORDER — EZETIMIBE 10 MG PO TABS: 10.0000 mg | ORAL_TABLET | Freq: Every day | ORAL | 3 refills | Status: DC

## 2021-12-20 NOTE — Assessment & Plan Note (Signed)
On losartan and amlodipine. Blood pressures as outlined.  Continue current medication regimen.  Follow pressures.  Follow metabolic panel.  

## 2021-12-20 NOTE — Assessment & Plan Note (Signed)
On thyroid replacement.  Follow tsh.  

## 2021-12-20 NOTE — Assessment & Plan Note (Signed)
Evaluated by [facility] (Dr Bridgett Larsson).   Follow pressure and follow metabolic panel.

## 2021-12-20 NOTE — Progress Notes (Signed)
Patient ID: Shelby Spencer, female   DOB: 09-Jan-1946, 76 y.o.   MRN: [id number]   Subjective:    Patient ID: Shelby Spencer, female    DOB: 1945/07/31, 76 y.o.   MRN: [id number]    Patient here for a scheduled follow up.  Chief Complaint  Patient presents with   Hypertension   Hyperlipidemia   .   HPI Reports doing relatively well.  No chest pain or sob with increased activity or exertion.  No cough or congestion.  No acid reflux reported.  No abdominal pain.  Bowels moving. States blood pressures on outside checks - low 812X systolic readings.  Has noticed some memory issues - minimal.  Discussed focus and concentration.     Past Medical History:  Diagnosis Date   Cataract    CKD (chronic kidney disease), stage III ([place])    History of colon polyps    History of prolapse of bladder    History of pyloric stenosis    Hypercholesterolemia    Hypertension    Thyroid disease    Past Surgical History:  Procedure Laterality Date   CATARACT EXTRACTION, BILATERAL     TONSILLECTOMY     TUBAL LIGATION     Family History  Problem Relation Age of Onset   Hypertension Mother    Heart disease Father    Heart attack Maternal Grandfather    Colon cancer Neg Hx    Esophageal cancer Neg Hx    Pancreatic cancer Neg Hx    Rectal cancer Neg Hx    Stomach cancer Neg Hx    Social History   Socioeconomic History   Marital status: Married    Spouse name: Not on file   Number of children: Not on file   Years of education: Not on file   Highest education level: Not on file  Occupational History   Not on file  Tobacco Use   Smoking status: Never   Smokeless tobacco: Never  Vaping Use   Vaping Use: Never used  Substance and Sexual Activity   Alcohol use: Yes    Alcohol/week: 5.0 standard drinks of alcohol    Types: 5 Glasses of wine per week    Comment: per week   Drug use: No   Sexual activity: Not Currently    Partners: Male    Comment: 1st intercourse-21, partners- 2, married-  49.5 yrs  Other Topics Concern   Not on file  Social History Narrative   Not on file   Social Determinants of Health   Financial Resource Strain: Low Risk  (06/11/2021)   Overall Financial Resource Strain (CARDIA)    Difficulty of Paying Living Expenses: Not hard at all  Food Insecurity: Not on file  Transportation Needs: Not on file  Physical Activity: Not on file  Stress: Not on file  Social Connections: Not on file     Review of Systems  Constitutional:  Negative for appetite change and unexpected weight change.  HENT:  Negative for congestion and sinus pressure.   Respiratory:  Negative for cough, chest tightness and shortness of breath.   Cardiovascular:  Negative for chest pain, palpitations and leg swelling.  Gastrointestinal:  Negative for abdominal pain, diarrhea, nausea and vomiting.  Genitourinary:  Negative for difficulty urinating and dysuria.  Musculoskeletal:  Negative for joint swelling and myalgias.  Skin:  Negative for color change and rash.  Neurological:  Negative for dizziness, light-headedness and headaches.  Psychiatric/Behavioral:  Negative for agitation and dysphoric mood.  Objective:     BP 128/74 (BP Location: Left Arm, Patient Position: Sitting, Cuff Size: Small)   Pulse 72   Temp 97.7 F (36.5 C) (Temporal)   Resp 15   Ht '5\' 8"'$  (1.727 m)   Wt 162 lb 6.4 oz (73.7 kg)   SpO2 99%   BMI 24.69 kg/m  Wt Readings from Last 3 Encounters:  12/20/21 162 lb 6.4 oz (73.7 kg)  08/06/21 164 lb 3.2 oz (74.5 kg)  03/26/21 165 lb (74.8 kg)    Physical Exam Vitals reviewed.  Constitutional:      General: She is not in acute distress.    Appearance: Normal appearance.  HENT:     Head: Normocephalic and atraumatic.     Right Ear: External ear normal.     Left Ear: External ear normal.  Eyes:     General: No scleral icterus.       Right eye: No discharge.        Left eye: No discharge.     Conjunctiva/sclera: Conjunctivae normal.  Neck:      Thyroid: No thyromegaly.  Cardiovascular:     Rate and Rhythm: Normal rate and regular rhythm.  Pulmonary:     Effort: No respiratory distress.     Breath sounds: Normal breath sounds. No wheezing.  Abdominal:     General: Bowel sounds are normal.     Palpations: Abdomen is soft.     Tenderness: There is no abdominal tenderness.  Musculoskeletal:        General: No swelling or tenderness.     Cervical back: Neck supple. No tenderness.  Lymphadenopathy:     Cervical: No cervical adenopathy.  Skin:    Findings: No erythema or rash.  Neurological:     Mental Status: She is alert.  Psychiatric:        Mood and Affect: Mood normal.        Behavior: Behavior normal.      Outpatient Encounter Medications as of 12/20/2021  Medication Sig   Calcium Carbonate-Vitamin D (CALCIUM + D PO) Take by mouth daily at 8 pm.   doxycycline (PERIOSTAT) 20 MG tablet Take 20 mg by mouth 2 (two) times daily.    levothyroxine (SYNTHROID) 50 MCG tablet TAKE 1 TABLET BY MOUTH ONCE DAILY BEFOREBREAKFAST   [DISCONTINUED] amLODipine (NORVASC) 2.5 MG tablet Take 1 tablet (2.5 mg total) by mouth 2 (two) times daily.   [DISCONTINUED] atorvastatin (LIPITOR) 10 MG tablet TAKE 1 TABLET BY MOUTH 2 DAYS A WEEK   [DISCONTINUED] ezetimibe (ZETIA) 10 MG tablet TAKE 1 TABLET BY MOUTH ONCE DAILY   [DISCONTINUED] losartan (COZAAR) 100 MG tablet TAKE 1 TABLET BY MOUTH ONCE DAILY   amLODipine (NORVASC) 2.5 MG tablet Take 1 tablet (2.5 mg total) by mouth 2 (two) times daily.   atorvastatin (LIPITOR) 10 MG tablet TAKE 1 TABLET BY MOUTH 2 DAYS A WEEK   ezetimibe (ZETIA) 10 MG tablet Take 1 tablet (10 mg total) by mouth daily.   losartan (COZAAR) 100 MG tablet Take 1 tablet (100 mg total) by mouth daily.   Facility-Administered Encounter Medications as of 12/20/2021  Medication   0.9 %  sodium chloride infusion     Lab Results  Component Value Date   WBC 6.4 12/20/2021   HGB 13.5 12/20/2021   HCT 40.5 12/20/2021   PLT  331.0 12/20/2021   GLUCOSE 96 12/17/2021   CHOL 181 12/17/2021   TRIG 100.0 12/17/2021   HDL 74.20 12/17/2021  LDLCALC 86 12/17/2021   ALT 10 12/17/2021   AST 10 12/17/2021   NA 140 12/17/2021   K 4.4 12/17/2021   CL 105 12/17/2021   CREATININE 1.04 12/17/2021   BUN 24 (H) 12/17/2021   CO2 25 12/17/2021   TSH 2.50 12/20/2021       Assessment & Plan:   Problem List Items Addressed This Visit     History of colonic polyps    Colonoscopy 02/2017.  Dr Henrene Pastor      Hypercholesterolemia    On zetia and now on lipitor three days per week. Tolerating.  Doing well.  Cholesterol improved.  Continue current medication regimen.  Follow lipid panel and liver function tests.   Lab Results  Component Value Date   CHOL 181 12/17/2021   HDL 74.20 12/17/2021   LDLCALC 86 12/17/2021   TRIG 100.0 12/17/2021   CHOLHDL 2 12/17/2021       Relevant Medications   amLODipine (NORVASC) 2.5 MG tablet   atorvastatin (LIPITOR) 10 MG tablet   ezetimibe (ZETIA) 10 MG tablet   losartan (COZAAR) 100 MG tablet   Other Relevant Orders   CBC with Differential/Platelet (Completed)   TSH (Completed)   Hepatic function panel   Lipid panel   Hypertension, essential    On losartan and amlodipine. Blood pressures as outlined.  Continue current medication regimen.  Follow pressures.  Follow metabolic panel.       Relevant Medications   amLODipine (NORVASC) 2.5 MG tablet   atorvastatin (LIPITOR) 10 MG tablet   ezetimibe (ZETIA) 10 MG tablet   losartan (COZAAR) 100 MG tablet   Other Relevant Orders   Basic metabolic panel   Hypothyroid - Primary    On thyroid replacement.  Follow tsh.       Memory change    Discussed memory.  Discussed focus and concentration issues.  Check cbc, tsh and b12.  Follow.       Relevant Orders   Vitamin B12 (Completed)   Renal artery stenosis ([facility])    Evaluated by [facility] (Dr Bridgett Larsson).   Follow pressure and follow metabolic panel.       Relevant  Medications   amLODipine (NORVASC) 2.5 MG tablet   atorvastatin (LIPITOR) 10 MG tablet   ezetimibe (ZETIA) 10 MG tablet   losartan (COZAAR) 100 MG tablet     Einar Pheasant, MD

## 2021-12-20 NOTE — Assessment & Plan Note (Signed)
On zetia and now on lipitor three days per week. Tolerating.  Doing well.  Cholesterol improved.  Continue current medication regimen.  Follow lipid panel and liver function tests.   Lab Results  Component Value Date   CHOL 181 12/17/2021   HDL 74.20 12/17/2021   LDLCALC 86 12/17/2021   TRIG 100.0 12/17/2021   CHOLHDL 2 12/17/2021

## 2021-12-20 NOTE — Assessment & Plan Note (Signed)
Colonoscopy 02/2017.  Dr Perry 

## 2021-12-20 NOTE — Assessment & Plan Note (Signed)
Discussed memory.  Discussed focus and concentration issues.  Check cbc, tsh and b12.  Follow.

## 2021-12-21 DIAGNOSIS — H26493 Other secondary cataract, bilateral: Secondary | ICD-10-CM | POA: Diagnosis not present

## 2021-12-27 ENCOUNTER — Encounter: Payer: Self-pay | Admitting: Obstetrics & Gynecology

## 2021-12-27 ENCOUNTER — Ambulatory Visit (INDEPENDENT_AMBULATORY_CARE_PROVIDER_SITE_OTHER): Payer: Medicare HMO | Admitting: Obstetrics & Gynecology

## 2021-12-27 VITALS — BP 110/70 | HR 70 | Ht 67.5 in | Wt 165.0 lb

## 2021-12-27 DIAGNOSIS — M8589 Other specified disorders of bone density and structure, multiple sites: Secondary | ICD-10-CM

## 2021-12-27 DIAGNOSIS — Z780 Asymptomatic menopausal state: Secondary | ICD-10-CM

## 2021-12-27 DIAGNOSIS — N8110 Cystocele, unspecified: Secondary | ICD-10-CM

## 2021-12-27 DIAGNOSIS — Z01419 Encounter for gynecological examination (general) (routine) without abnormal findings: Secondary | ICD-10-CM | POA: Diagnosis not present

## 2021-12-27 NOTE — Progress Notes (Signed)
Shelby Spencer 26-Nov-1945 [id number]   History:    76 y.o. G3P2A1 Married.  Biologist Education administrator).  Has grand-children.   RP:  Established patient presenting for annual gyn exam    HPI: Postmenopause, well on no HRT.  No PMB.  Small bladder prolapse stable with minimal symptoms. No Urinary incontinence, except very rarely when the bladder is too full.  BMs normal. Abstinent.  No h/o abnormal Pap.  No indication to repeat at this time.  Breasts normal.  Screening mammo 06/2021 Left Neg, Rt Dx mammo/US benign.  BD Osteopenia in 2023.  [place] 2018, will schedule at [facility] this year. Health labs with Fam MD.  BMI 25.46. Continue with fitness and healthy nutrition.    Past medical history,surgical history, family history and social history were all reviewed and documented in the EPIC chart.  Gynecologic History No LMP recorded. Patient is postmenopausal.  Obstetric History OB History  Gravida Para Term Preterm AB Living  '3 2 2   1 2  '$ SAB IAB Ectopic Multiple Live Births  1            # Outcome Date GA Lbr Len/2nd Weight Sex Delivery Anes PTL Lv  3 SAB           2 Term           1 Term              ROS: A ROS was performed and pertinent positives and negatives are included in the history. GENERAL: No fevers or chills. HEENT: No change in vision, no earache, sore throat or sinus congestion. NECK: No pain or stiffness. CARDIOVASCULAR: No chest pain or pressure. No palpitations. PULMONARY: No shortness of breath, cough or wheeze. GASTROINTESTINAL: No abdominal pain, nausea, vomiting or diarrhea, melena or bright red blood per rectum. GENITOURINARY: No urinary frequency, urgency, hesitancy or dysuria. MUSCULOSKELETAL: No joint or muscle pain, no back pain, no recent trauma. DERMATOLOGIC: No rash, no itching, no lesions. ENDOCRINE: No polyuria, polydipsia, no heat or cold intolerance. No recent change in weight. HEMATOLOGICAL: No anemia or easy bruising or bleeding. NEUROLOGIC: No headache,  seizures, numbness, tingling or weakness. PSYCHIATRIC: No depression, no loss of interest in normal activity or change in sleep pattern.     Exam:   BP 110/70   Pulse 70   Ht 5' 7.5" (1.715 m)   Wt 165 lb (74.8 kg)   SpO2 96%   BMI 25.46 kg/m   Body mass index is 25.46 kg/m.  General appearance : Well developed well nourished female. No acute distress HEENT: Eyes: no retinal hemorrhage or exudates,  Neck supple, trachea midline, no carotid bruits, no thyroidmegaly Lungs: Clear to auscultation, no rhonchi or wheezes, or rib retractions  Heart: Regular rate and rhythm, no murmurs or gallops Breast:Examined in sitting and supine position were symmetrical in appearance, no palpable masses or tenderness,  no skin retraction, no nipple inversion, no nipple discharge, no skin discoloration, no axillary or supraclavicular lymphadenopathy Abdomen: no palpable masses or tenderness, no rebound or guarding Extremities: no edema or skin discoloration or tenderness  Pelvic: Vulva: Normal             Vagina: No gross lesions or discharge.  Grade 3/4 Cystocele.  Cervix: No gross lesions or discharge  Uterus  AV, normal size, shape and consistency, non-tender and mobile  Adnexa  Without masses or tenderness  Anus: Normal   Assessment/Plan:  76 y.o. female for annual exam   1.  Well female exam with routine gynecological exam Postmenopause, well on no HRT.  No PMB.  Small bladder prolapse stable with minimal symptoms. No Urinary incontinence, except very rarely when the bladder is too full.  BMs normal. Abstinent.  No h/o abnormal Pap.  No indication to repeat at this time.  Breasts normal.  Screening mammo 06/2021 Left Neg, Rt Dx mammo/US benign.  BD Osteopenia in 2023.  [place] 2018, will schedule at [facility] this year. Health labs with Fam MD.  BMI 25.46. Continue with fitness and healthy nutrition.  2. Postmenopausal Postmenopause, well on no HRT.  No PMB.   3. Osteopenia of multiple sites   BD Osteopenia in 2023, stable.  Vit D, Ca++ total 1.5 g/d and regular weight bearing physical activities.  4. Baden-Walker grade 3 cystocele  Small bladder prolapse stable with minimal symptoms. No Urinary incontinence, except very rarely when the bladder is too full.   Princess Bruins MD, 10:18 AM 12/27/2021

## 2022-01-23 ENCOUNTER — Encounter: Payer: Self-pay | Admitting: Internal Medicine

## 2022-02-13 ENCOUNTER — Other Ambulatory Visit: Payer: Self-pay | Admitting: Internal Medicine

## 2022-04-24 ENCOUNTER — Other Ambulatory Visit (INDEPENDENT_AMBULATORY_CARE_PROVIDER_SITE_OTHER): Payer: Medicare HMO

## 2022-04-24 DIAGNOSIS — I10 Essential (primary) hypertension: Secondary | ICD-10-CM | POA: Diagnosis not present

## 2022-04-24 DIAGNOSIS — E7800 Pure hypercholesterolemia, unspecified: Secondary | ICD-10-CM | POA: Diagnosis not present

## 2022-04-24 LAB — BASIC METABOLIC PANEL
BUN: 25 mg/dL — ABNORMAL HIGH (ref 6–23)
CO2: 27 mEq/L (ref 19–32)
Calcium: 9.4 mg/dL (ref 8.4–10.5)
Chloride: 104 mEq/L (ref 96–112)
Creatinine, Ser: 1.09 mg/dL (ref 0.40–1.20)
GFR: 49.74 mL/min — ABNORMAL LOW (ref >60.00)
Glucose, Bld: 126 mg/dL — ABNORMAL HIGH (ref 70–99)
Potassium: 4.4 mEq/L (ref 3.5–5.1)
Sodium: 138 mEq/L (ref 135–145)

## 2022-04-24 LAB — LIPID PANEL
Cholesterol: 196 mg/dL (ref 0–200)
HDL: 81.3 mg/dL (ref >39.00)
LDL Cholesterol: 87 mg/dL (ref 0–99)
NonHDL: 114.67
Total CHOL/HDL Ratio: 2
Triglycerides: 136 mg/dL (ref 0.0–149.0)
VLDL: 27.2 mg/dL (ref 0.0–40.0)

## 2022-04-24 LAB — HEPATIC FUNCTION PANEL
ALT: 11 U/L (ref 0–35)
AST: 11 U/L (ref 0–37)
Albumin: 4.4 g/dL (ref 3.5–5.2)
Alkaline Phosphatase: 68 U/L (ref 39–117)
Bilirubin, Direct: 0.1 mg/dL (ref 0.0–0.3)
Total Bilirubin: 0.7 mg/dL (ref 0.2–1.2)
Total Protein: 6.5 g/dL (ref 6.0–8.3)

## 2022-04-29 ENCOUNTER — Encounter: Payer: Self-pay | Admitting: Internal Medicine

## 2022-04-29 ENCOUNTER — Ambulatory Visit (INDEPENDENT_AMBULATORY_CARE_PROVIDER_SITE_OTHER): Payer: Medicare HMO | Admitting: Internal Medicine

## 2022-04-29 VITALS — BP 132/78 | HR 64 | Temp 98.2°F | Resp 15 | Ht 67.5 in | Wt 166.2 lb

## 2022-04-29 DIAGNOSIS — I10 Essential (primary) hypertension: Secondary | ICD-10-CM

## 2022-04-29 DIAGNOSIS — N1831 Chronic kidney disease, stage 3a: Secondary | ICD-10-CM | POA: Diagnosis not present

## 2022-04-29 DIAGNOSIS — E039 Hypothyroidism, unspecified: Secondary | ICD-10-CM | POA: Diagnosis not present

## 2022-04-29 DIAGNOSIS — E7800 Pure hypercholesterolemia, unspecified: Secondary | ICD-10-CM

## 2022-04-29 DIAGNOSIS — Z86010 Personal history of colonic polyps: Secondary | ICD-10-CM

## 2022-04-29 DIAGNOSIS — R739 Hyperglycemia, unspecified: Secondary | ICD-10-CM | POA: Insufficient documentation

## 2022-04-29 DIAGNOSIS — R413 Other amnesia: Secondary | ICD-10-CM | POA: Diagnosis not present

## 2022-04-29 NOTE — Progress Notes (Signed)
Patient ID: Shelby Spencer, female   DOB: April 12, 1946, 76 y.o.   MRN: [id number]   Subjective:    Patient ID: Shelby Spencer, female    DOB: Dec 14, 1945, 76 y.o.   MRN: [id number]   Patient here for  Chief Complaint  Patient presents with   Hyperlipidemia   Hypothyroidism   .   HPI Here to follow up regarding hypercholesterolemia and hypertension.  Saw gyn 12/27/21 - routine visit. States she is doing well.  Feels good.  Stays active.  No chest pain or sob reported.  No abdominal pain or bowel change reported.  Did mention some concerns regarding memory issues.  Discussed.  Taking oral B12.    Past Medical History:  Diagnosis Date   Cataract    CKD (chronic kidney disease), stage III ([place])    History of colon polyps    History of prolapse of bladder    History of pyloric stenosis    Hypercholesterolemia    Hypertension    Thyroid disease    Past Surgical History:  Procedure Laterality Date   CATARACT EXTRACTION, BILATERAL     TONSILLECTOMY     TUBAL LIGATION     Family History  Problem Relation Age of Onset   Hypertension Mother    Heart disease Father    Heart attack Maternal Grandfather    Pancreatic cancer Neg Hx    Rectal cancer Neg Hx    Stomach cancer Neg Hx    Social History   Socioeconomic History   Marital status: Married    Spouse name: Not on file   Number of children: Not on file   Years of education: Not on file   Highest education level: Not on file  Occupational History   Not on file  Tobacco Use   Smoking status: Never   Smokeless tobacco: Never  Vaping Use   Vaping Use: Never used  Substance and Sexual Activity   Alcohol use: Yes    Alcohol/week: 5.0 standard drinks of alcohol    Types: 5 Glasses of wine per week    Comment: per week   Drug use: No   Sexual activity: Not Currently    Partners: Male    Comment: 1st intercourse-21, partners- 2  Other Topics Concern   Not on file  Social History Narrative   Not on file   Social  Determinants of Health   Financial Resource Strain: Low Risk  (06/11/2021)   Overall Financial Resource Strain (CARDIA)    Difficulty of Paying Living Expenses: Not hard at all  Food Insecurity: Not on file  Transportation Needs: Not on file  Physical Activity: Not on file  Stress: Not on file  Social Connections: Not on file     Review of Systems  Constitutional:  Negative for appetite change and unexpected weight change.  HENT:  Negative for congestion and sinus pressure.   Respiratory:  Negative for cough, chest tightness and shortness of breath.   Cardiovascular:  Negative for chest pain, palpitations and leg swelling.  Gastrointestinal:  Negative for abdominal pain, diarrhea, nausea and vomiting.  Genitourinary:  Negative for difficulty urinating and dysuria.  Musculoskeletal:  Negative for joint swelling and myalgias.  Skin:  Negative for color change and rash.  Neurological:  Negative for dizziness and headaches.  Psychiatric/Behavioral:  Negative for agitation and dysphoric mood.        Objective:     BP 132/78   Pulse 64   Temp 98.2 F (36.8 C) (  Temporal)   Resp 15   Ht 5' 7.5" (1.715 m)   Wt 166 lb 3.2 oz (75.4 kg)   SpO2 97%   BMI 25.65 kg/m  Wt Readings from Last 3 Encounters:  04/29/22 166 lb 3.2 oz (75.4 kg)  12/27/21 165 lb (74.8 kg)  12/20/21 162 lb 6.4 oz (73.7 kg)    Physical Exam Vitals reviewed.  Constitutional:      General: She is not in acute distress.    Appearance: Normal appearance.  HENT:     Head: Normocephalic and atraumatic.     Right Ear: External ear normal.     Left Ear: External ear normal.  Eyes:     General: No scleral icterus.       Right eye: No discharge.        Left eye: No discharge.     Conjunctiva/sclera: Conjunctivae normal.  Neck:     Thyroid: No thyromegaly.  Cardiovascular:     Rate and Rhythm: Normal rate and regular rhythm.  Pulmonary:     Effort: No respiratory distress.     Breath sounds: Normal breath  sounds. No wheezing.  Abdominal:     General: Bowel sounds are normal.     Palpations: Abdomen is soft.     Tenderness: There is no abdominal tenderness.  Musculoskeletal:        General: No swelling or tenderness.     Cervical back: Neck supple. No tenderness.  Lymphadenopathy:     Cervical: No cervical adenopathy.  Skin:    Findings: No erythema or rash.  Neurological:     Mental Status: She is alert.  Psychiatric:        Mood and Affect: Mood normal.        Behavior: Behavior normal.      Outpatient Encounter Medications as of 04/29/2022  Medication Sig   amLODipine (NORVASC) 2.5 MG tablet Take 1 tablet (2.5 mg total) by mouth 2 (two) times daily.   atorvastatin (LIPITOR) 10 MG tablet TAKE 1 TABLET BY MOUTH 2 DAYS A WEEK   Calcium Carbonate-Vitamin D (CALCIUM + D PO) Take by mouth daily at 8 pm.   doxycycline (PERIOSTAT) 20 MG tablet Take 20 mg by mouth 2 (two) times daily.    ezetimibe (ZETIA) 10 MG tablet Take 1 tablet (10 mg total) by mouth daily.   levothyroxine (SYNTHROID) 50 MCG tablet TAKE 1 TABLET BY MOUTH ONCE DAILY BEFOREBREAKFAST   losartan (COZAAR) 100 MG tablet Take 1 tablet (100 mg total) by mouth daily.   No facility-administered encounter medications on file as of 04/29/2022.     Lab Results  Component Value Date   WBC 6.4 12/20/2021   HGB 13.5 12/20/2021   HCT 40.5 12/20/2021   PLT 331.0 12/20/2021   GLUCOSE 126 (H) 04/24/2022   CHOL 196 04/24/2022   TRIG 136.0 04/24/2022   HDL 81.30 04/24/2022   LDLCALC 87 04/24/2022   ALT 11 04/24/2022   AST 11 04/24/2022   NA 138 04/24/2022   K 4.4 04/24/2022   CL 104 04/24/2022   CREATININE 1.09 04/24/2022   BUN 25 (H) 04/24/2022   CO2 27 04/24/2022   TSH 2.50 12/20/2021       Assessment & Plan:   Problem List Items Addressed This Visit     CKD (chronic kidney disease) stage 3, GFR 30-59 ml/min ([facility])    GFR 49 last check.  Continue losartan.  Avoid antiinflammatories.  Stay hydrated.  Follow  metabolic panel.  History of colonic polyps    Colonoscopy 02/2017.  Dr Henrene Pastor      Hypercholesterolemia    On zetia and now on lipitor three days per week. Tolerating.  Doing well.  Cholesterol improved.  Continue current medication regimen.  Follow lipid panel and liver function tests.   Lab Results  Component Value Date   CHOL 196 04/24/2022   HDL 81.30 04/24/2022   LDLCALC 87 04/24/2022   TRIG 136.0 04/24/2022   CHOLHDL 2 04/24/2022       Hyperglycemia - Primary    Low carb diet and exercise.  Follow met b and check A1c next labs.       Relevant Orders   Hemoglobin A1c   Hypertension, essential    On losartan and amlodipine. Blood pressures as outlined.  Continue current medication regimen.  Follow pressures.  Follow metabolic panel.       Relevant Orders   Basic metabolic panel   Hypothyroid    On thyroid replacement.  Follow tsh.       Memory change    Discussed memory.  Discussed focus and concentration issues.  Continue oral B12. Follow.         Einar Pheasant, MD

## 2022-05-05 ENCOUNTER — Encounter: Payer: Self-pay | Admitting: Internal Medicine

## 2022-05-05 DIAGNOSIS — N183 Chronic kidney disease, stage 3 unspecified: Secondary | ICD-10-CM | POA: Insufficient documentation

## 2022-05-05 NOTE — Assessment & Plan Note (Signed)
Discussed memory.  Discussed focus and concentration issues.  Continue oral B12. Follow.

## 2022-05-05 NOTE — Assessment & Plan Note (Signed)
Colonoscopy 02/2017.  Dr Henrene Pastor

## 2022-05-05 NOTE — Assessment & Plan Note (Addendum)
Low carb diet and exercise.  Follow met b and check A1c next labs.

## 2022-05-05 NOTE — Assessment & Plan Note (Signed)
On zetia and now on lipitor three days per week. Tolerating.  Doing well.  Cholesterol improved.  Continue current medication regimen.  Follow lipid panel and liver function tests.   Lab Results  Component Value Date   CHOL 196 04/24/2022   HDL 81.30 04/24/2022   LDLCALC 87 04/24/2022   TRIG 136.0 04/24/2022   CHOLHDL 2 04/24/2022

## 2022-05-05 NOTE — Assessment & Plan Note (Signed)
On losartan and amlodipine. Blood pressures as outlined.  Continue current medication regimen.  Follow pressures.  Follow metabolic panel.

## 2022-05-05 NOTE — Assessment & Plan Note (Signed)
On thyroid replacement.  Follow tsh.  

## 2022-05-05 NOTE — Assessment & Plan Note (Signed)
GFR 49 last check.  Continue losartan.  Avoid antiinflammatories.  Stay hydrated.  Follow metabolic panel.

## 2022-05-07 ENCOUNTER — Other Ambulatory Visit: Payer: Self-pay

## 2022-05-07 ENCOUNTER — Telehealth: Payer: Self-pay

## 2022-05-07 ENCOUNTER — Other Ambulatory Visit: Payer: Self-pay | Admitting: Internal Medicine

## 2022-05-07 MED ORDER — AMLODIPINE BESYLATE 2.5 MG PO TABS: 2.5000 mg | ORAL_TABLET | Freq: Two times a day (BID) | ORAL | 1 refills | Status: DC

## 2022-05-07 NOTE — Telephone Encounter (Signed)
SENT 

## 2022-05-07 NOTE — Telephone Encounter (Signed)
Mandi is calling from [facility] to state she needs a new prescription for patient's amLODipine (NORVASC) 2.5 MG tablet.  Mandi states she needs a 90-day supply with patient taking two tablets per day.

## 2022-05-24 ENCOUNTER — Telehealth: Payer: Self-pay | Admitting: Internal Medicine

## 2022-05-24 NOTE — Telephone Encounter (Signed)
Agree with evaluation to help determine etiology of symptoms - to determine best treatment.

## 2022-05-24 NOTE — Telephone Encounter (Signed)
S/w pt - current sx are :  Congestion 100 fever Fatigue Body aches   Started yesterday No sob, chills, chest pain   Pt agreeable to go to [facility] across the street for eval Advised needs to be seen for medication

## 2022-05-24 NOTE — Telephone Encounter (Signed)
Pt called stating she has flu like symptoms and would like medication called in

## 2022-05-25 DIAGNOSIS — U071 COVID-19: Secondary | ICD-10-CM | POA: Diagnosis not present

## 2022-05-25 DIAGNOSIS — J029 Acute pharyngitis, unspecified: Secondary | ICD-10-CM | POA: Diagnosis not present

## 2022-05-25 DIAGNOSIS — Z03818 Encounter for observation for suspected exposure to other biological agents ruled out: Secondary | ICD-10-CM | POA: Diagnosis not present

## 2022-05-29 ENCOUNTER — Other Ambulatory Visit: Payer: Self-pay

## 2022-06-06 ENCOUNTER — Ambulatory Visit: Payer: Medicare HMO

## 2022-06-10 ENCOUNTER — Other Ambulatory Visit: Payer: Medicare HMO

## 2022-06-18 ENCOUNTER — Other Ambulatory Visit: Payer: Self-pay | Admitting: Internal Medicine

## 2022-06-21 DIAGNOSIS — D2271 Melanocytic nevi of right lower limb, including hip: Secondary | ICD-10-CM | POA: Diagnosis not present

## 2022-06-21 DIAGNOSIS — D2261 Melanocytic nevi of right upper limb, including shoulder: Secondary | ICD-10-CM | POA: Diagnosis not present

## 2022-06-21 DIAGNOSIS — D2272 Melanocytic nevi of left lower limb, including hip: Secondary | ICD-10-CM | POA: Diagnosis not present

## 2022-06-21 DIAGNOSIS — D225 Melanocytic nevi of trunk: Secondary | ICD-10-CM | POA: Diagnosis not present

## 2022-06-21 DIAGNOSIS — L814 Other melanin hyperpigmentation: Secondary | ICD-10-CM | POA: Diagnosis not present

## 2022-06-21 DIAGNOSIS — D2262 Melanocytic nevi of left upper limb, including shoulder: Secondary | ICD-10-CM | POA: Diagnosis not present

## 2022-06-21 DIAGNOSIS — L821 Other seborrheic keratosis: Secondary | ICD-10-CM | POA: Diagnosis not present

## 2022-06-27 ENCOUNTER — Other Ambulatory Visit: Payer: Self-pay | Admitting: Internal Medicine

## 2022-07-01 DIAGNOSIS — Z1231 Encounter for screening mammogram for malignant neoplasm of breast: Secondary | ICD-10-CM | POA: Diagnosis not present

## 2022-07-01 LAB — HM MAMMOGRAPHY

## 2022-07-08 LAB — HM MAMMOGRAPHY

## 2022-08-23 ENCOUNTER — Other Ambulatory Visit: Payer: Self-pay | Admitting: Internal Medicine

## 2022-08-27 ENCOUNTER — Other Ambulatory Visit (INDEPENDENT_AMBULATORY_CARE_PROVIDER_SITE_OTHER): Payer: Medicare HMO

## 2022-08-27 DIAGNOSIS — I10 Essential (primary) hypertension: Secondary | ICD-10-CM

## 2022-08-27 DIAGNOSIS — R739 Hyperglycemia, unspecified: Secondary | ICD-10-CM

## 2022-08-27 LAB — BASIC METABOLIC PANEL
BUN: 22 mg/dL (ref 6–23)
CO2: 28 mEq/L (ref 19–32)
Calcium: 9.7 mg/dL (ref 8.4–10.5)
Chloride: 104 mEq/L (ref 96–112)
Creatinine, Ser: 1.13 mg/dL (ref 0.40–1.20)
GFR: 47.52 mL/min — ABNORMAL LOW (ref >60.00)
Glucose, Bld: 94 mg/dL (ref 70–99)
Potassium: 4.6 mEq/L (ref 3.5–5.1)
Sodium: 138 mEq/L (ref 135–145)

## 2022-08-27 LAB — HEMOGLOBIN A1C: Hgb A1c MFr Bld: 5.6 % (ref 4.6–6.5)

## 2022-08-28 ENCOUNTER — Other Ambulatory Visit: Payer: Self-pay | Admitting: Internal Medicine

## 2022-08-28 ENCOUNTER — Other Ambulatory Visit (INDEPENDENT_AMBULATORY_CARE_PROVIDER_SITE_OTHER): Payer: Medicare HMO

## 2022-08-28 DIAGNOSIS — E7800 Pure hypercholesterolemia, unspecified: Secondary | ICD-10-CM

## 2022-08-28 LAB — LIPID PANEL
Cholesterol: 193 mg/dL (ref 0–200)
HDL: 79.1 mg/dL (ref >39.00)
LDL Cholesterol: 91 mg/dL (ref 0–99)
NonHDL: 114.26
Total CHOL/HDL Ratio: 2
Triglycerides: 118 mg/dL (ref 0.0–149.0)
VLDL: 23.6 mg/dL (ref 0.0–40.0)

## 2022-08-28 LAB — HEPATIC FUNCTION PANEL
ALT: 15 U/L (ref 0–35)
AST: 13 U/L (ref 0–37)
Albumin: 4.3 g/dL (ref 3.5–5.2)
Alkaline Phosphatase: 72 U/L (ref 39–117)
Bilirubin, Direct: 0.1 mg/dL (ref 0.0–0.3)
Total Bilirubin: 0.6 mg/dL (ref 0.2–1.2)
Total Protein: 6.6 g/dL (ref 6.0–8.3)

## 2022-08-28 NOTE — Progress Notes (Signed)
Add on labs ordered

## 2022-08-29 ENCOUNTER — Ambulatory Visit (INDEPENDENT_AMBULATORY_CARE_PROVIDER_SITE_OTHER): Payer: Medicare HMO | Admitting: Internal Medicine

## 2022-08-29 ENCOUNTER — Encounter: Payer: Self-pay | Admitting: Internal Medicine

## 2022-08-29 VITALS — BP 126/72 | HR 78 | Temp 98.3°F | Resp 16 | Ht 68.0 in | Wt 169.4 lb

## 2022-08-29 DIAGNOSIS — I10 Essential (primary) hypertension: Secondary | ICD-10-CM | POA: Diagnosis not present

## 2022-08-29 DIAGNOSIS — E7800 Pure hypercholesterolemia, unspecified: Secondary | ICD-10-CM | POA: Diagnosis not present

## 2022-08-29 DIAGNOSIS — Z0000 Encounter for general adult medical examination without abnormal findings: Secondary | ICD-10-CM | POA: Diagnosis not present

## 2022-08-29 DIAGNOSIS — R739 Hyperglycemia, unspecified: Secondary | ICD-10-CM | POA: Diagnosis not present

## 2022-08-29 DIAGNOSIS — R413 Other amnesia: Secondary | ICD-10-CM

## 2022-08-29 DIAGNOSIS — Z1211 Encounter for screening for malignant neoplasm of colon: Secondary | ICD-10-CM

## 2022-08-29 DIAGNOSIS — E039 Hypothyroidism, unspecified: Secondary | ICD-10-CM

## 2022-08-29 DIAGNOSIS — N1831 Chronic kidney disease, stage 3a: Secondary | ICD-10-CM

## 2022-08-29 DIAGNOSIS — I701 Atherosclerosis of renal artery: Secondary | ICD-10-CM

## 2022-08-29 MED ORDER — EZETIMIBE 10 MG PO TABS: 10.0000 mg | ORAL_TABLET | Freq: Every day | ORAL | 3 refills | Status: DC

## 2022-08-29 MED ORDER — LOSARTAN POTASSIUM 100 MG PO TABS: 100.0000 mg | ORAL_TABLET | Freq: Every day | ORAL | 1 refills | Status: DC

## 2022-08-29 MED ORDER — ATORVASTATIN CALCIUM 10 MG PO TABS: 10.0000 mg | ORAL_TABLET | ORAL | 3 refills | Status: DC

## 2022-08-29 MED ORDER — LEVOTHYROXINE SODIUM 50 MCG PO TABS: ORAL_TABLET | ORAL | 2 refills | Status: DC

## 2022-08-29 NOTE — Assessment & Plan Note (Addendum)
Sees [facility] for her breast, pelvic and pap smears.  Colonoscopy 02/25/17 - one 74mm cecal polyp with diverticulosis and internal hemorrhoids. Recommended f/u colonoscopy in 5 years. Due f/u colonoscopy.  Mammogram 07/01/22 - recommended f/u left breast mammogram.  F/u left breast mammogram ok - recommended f/u screening mammogram 07/2023.

## 2022-08-29 NOTE — Progress Notes (Signed)
Subjective:    Patient ID: Shelby Spencer, female    DOB: [birth year], 76 y.o.   MRN: [id number]  Patient here for  Chief Complaint  Patient presents with   Annual Exam    HPI Here for a physical exam. Saw gyn 12/27/21 - routine visit.  Reports she is doing relatively well.  Stays active.  Gardening.  No chest pain or sob reported.  No abdominal pain or bowel change reported. Discussed memory.  Repeating herself.  Family has notived.     Past Medical History:  Diagnosis Date   Cataract    CKD (chronic kidney disease), stage III ([place])    History of colon polyps    History of prolapse of bladder    History of pyloric stenosis    Hypercholesterolemia    Hypertension    Thyroid disease    Past Surgical History:  Procedure Laterality Date   CATARACT EXTRACTION, BILATERAL     TONSILLECTOMY     TUBAL LIGATION     Family History  Problem Relation Age of Onset   Hypertension Mother    Heart disease Father    Heart attack Maternal Grandfather    Pancreatic cancer Neg Hx    Rectal cancer Neg Hx    Stomach cancer Neg Hx    Social History   Socioeconomic History   Marital status: Married    Spouse name: Not on file   Number of children: Not on file   Years of education: Not on file   Highest education level: Not on file  Occupational History   Not on file  Tobacco Use   Smoking status: Never   Smokeless tobacco: Never  Vaping Use   Vaping Use: Never used  Substance and Sexual Activity   Alcohol use: Yes    Alcohol/week: 5.0 standard drinks of alcohol    Types: 5 Glasses of wine per week    Comment: per week   Drug use: No   Sexual activity: Not Currently    Partners: Male    Comment: 1st intercourse-21, partners- 2  Other Topics Concern   Not on file  Social History Narrative   Not on file   Social Determinants of Health   Financial Resource Strain: Low Risk  (06/11/2021)   Overall Financial Resource Strain (CARDIA)    Difficulty of Paying Living Expenses: Not  hard at all  Food Insecurity: Not on file  Transportation Needs: Not on file  Physical Activity: Not on file  Stress: Not on file  Social Connections: Not on file     Review of Systems  Constitutional:  Negative for appetite change and unexpected weight change.  HENT:  Negative for congestion, sinus pressure and sore throat.   Eyes:  Negative for pain and visual disturbance.  Respiratory:  Negative for cough, chest tightness and shortness of breath.   Cardiovascular:  Negative for chest pain and palpitations.  Gastrointestinal:  Negative for abdominal pain, diarrhea, nausea and vomiting.  Genitourinary:  Negative for difficulty urinating and frequency.  Musculoskeletal:  Negative for joint swelling and myalgias.  Skin:  Negative for color change and rash.  Neurological:  Negative for dizziness and headaches.  Hematological:  Negative for adenopathy. Does not bruise/bleed easily.  Psychiatric/Behavioral:  Negative for decreased concentration and dysphoric mood.        Objective:     BP 126/72   Pulse 78   Temp 98.3 F (36.8 C)   Resp 16   Ht 5\' 8"  (  1.727 m)   Wt 169 lb 6.4 oz (76.8 kg)   SpO2 98%   BMI 25.76 kg/m  Wt Readings from Last 3 Encounters:  08/29/22 169 lb 6.4 oz (76.8 kg)  04/29/22 166 lb 3.2 oz (75.4 kg)  12/27/21 165 lb (74.8 kg)    Physical Exam Vitals reviewed.  Constitutional:      General: She is not in acute distress.    Appearance: Normal appearance. She is well-developed.  HENT:     Head: Normocephalic and atraumatic.     Right Ear: External ear normal.     Left Ear: External ear normal.  Eyes:     General: No scleral icterus.       Right eye: No discharge.        Left eye: No discharge.     Conjunctiva/sclera: Conjunctivae normal.  Neck:     Thyroid: No thyromegaly.  Cardiovascular:     Rate and Rhythm: Normal rate and regular rhythm.  Pulmonary:     Effort: No tachypnea, accessory muscle usage or respiratory distress.     Breath  sounds: Normal breath sounds. No decreased breath sounds or wheezing.  Chest:  Breasts:    Right: No inverted nipple, mass, nipple discharge or tenderness (no axillary adenopathy).     Left: No inverted nipple, mass, nipple discharge or tenderness (no axilarry adenopathy).  Abdominal:     General: Bowel sounds are normal.     Palpations: Abdomen is soft.     Tenderness: There is no abdominal tenderness.  Musculoskeletal:        General: No swelling or tenderness.     Cervical back: Neck supple.  Lymphadenopathy:     Cervical: No cervical adenopathy.  Skin:    Findings: No erythema or rash.  Neurological:     Mental Status: She is alert and oriented to person, place, and time.  Psychiatric:        Mood and Affect: Mood normal.        Behavior: Behavior normal.      Outpatient Encounter Medications as of 08/29/2022  Medication Sig   amLODipine (NORVASC) 2.5 MG tablet TAKE 1 TABLET BY MOUTH TWICE DAILY   atorvastatin (LIPITOR) 10 MG tablet Take 1 tablet (10 mg total) by mouth 2 (two) times a week. TAKE 1 TABLET BY MOUTH 2 DAYS A WEEK   Calcium Carbonate-Vitamin D (CALCIUM + D PO) Take by mouth daily at 8 pm.   doxycycline (PERIOSTAT) 20 MG tablet Take 20 mg by mouth 2 (two) times daily.    ezetimibe (ZETIA) 10 MG tablet Take 1 tablet (10 mg total) by mouth daily.   levothyroxine (SYNTHROID) 50 MCG tablet TAKE 1 TABLET BY MOUTH ONCE DAILY BEFOREBREAKFAST   losartan (COZAAR) 100 MG tablet Take 1 tablet (100 mg total) by mouth daily.   [DISCONTINUED] atorvastatin (LIPITOR) 10 MG tablet TAKE 1 TABLET BY MOUTH 2 DAYS A WEEK   [DISCONTINUED] ezetimibe (ZETIA) 10 MG tablet Take 1 tablet (10 mg total) by mouth daily.   [DISCONTINUED] levothyroxine (SYNTHROID) 50 MCG tablet TAKE 1 TABLET BY MOUTH ONCE DAILY BEFOREBREAKFAST   [DISCONTINUED] losartan (COZAAR) 100 MG tablet Take 1 tablet (100 mg total) by mouth daily.   No facility-administered encounter medications on file as of 08/29/2022.      Lab Results  Component Value Date   WBC 6.4 12/20/2021   HGB 13.5 12/20/2021   HCT 40.5 12/20/2021   PLT 331.0 12/20/2021   GLUCOSE 94 08/27/2022  CHOL 193 08/28/2022   TRIG 118.0 08/28/2022   HDL 79.10 08/28/2022   LDLCALC 91 08/28/2022   ALT 15 08/28/2022   AST 13 08/28/2022   NA 138 08/27/2022   K 4.6 08/27/2022   CL 104 08/27/2022   CREATININE 1.13 08/27/2022   BUN 22 08/27/2022   CO2 28 08/27/2022   TSH 2.50 12/20/2021   HGBA1C 5.6 08/27/2022    No results found.     Assessment & Plan:  Routine general medical examination at a health care facility  Health care maintenance Assessment & Plan: Sees [facility] for her breast, pelvic and pap smears.  Colonoscopy 02/25/17 - one 23mm cecal polyp with diverticulosis and internal hemorrhoids. Recommended f/u colonoscopy in 5 years. Due f/u colonoscopy.  Mammogram 07/01/22 - recommended f/u left breast mammogram.  F/u left breast mammogram ok - recommended f/u screening mammogram 07/2023.    Colon cancer screening -     Ambulatory referral to Gastroenterology  Hypertension, essential Assessment & Plan: On losartan and amlodipine. Blood pressures as outlined.  Continue current medication regimen.  Follow pressures.  Follow metabolic panel.   Orders: -     Basic metabolic panel; Future -     TSH; Future -     CBC with Differential/Platelet; Future  Hyperglycemia Assessment & Plan: Low carb diet and exercise.  Follow met b and check A1c next labs.   Orders: -     Hemoglobin A1c; Future  Hypercholesterolemia Assessment & Plan: On zetia and now on lipitor three days per week. Tolerating.  Doing well.  Cholesterol improved.  Continue current medication regimen.  Follow lipid panel and liver function tests.   Lab Results  Component Value Date   CHOL 193 08/28/2022   HDL 79.10 08/28/2022   LDLCALC 91 08/28/2022   TRIG 118.0 08/28/2022   CHOLHDL 2 08/28/2022    Orders: -     Lipid panel; Future -     Hepatic  function panel; Future  Stage 3a chronic kidney disease ([place]) Assessment & Plan: GFR 47 08/27/22.  Continue losartan.  Avoid antiinflammatories.  Stay hydrated.  Follow metabolic panel.   Orders: -     Basic metabolic panel; Future  Hypothyroidism, unspecified type Assessment & Plan: On thyroid replacement.  Follow tsh.    Memory change Assessment & Plan: Discussed memory.  Continue oral B12. She reports husband has noted - repeating herself.  Will refer to neurology for formal evaluation.    Orders: -     Ambulatory referral to Neurology  Renal artery stenosis [facility]) Assessment & Plan: Evaluated by [facility] (Dr Bridgett Larsson).   Follow pressure and follow metabolic panel.    Other orders -     Atorvastatin Calcium; Take 1 tablet (10 mg total) by mouth 2 (two) times a week. TAKE 1 TABLET BY MOUTH 2 DAYS A WEEK  Dispense: 26 tablet; Refill: 3 -     Ezetimibe; Take 1 tablet (10 mg total) by mouth daily.  Dispense: 90 tablet; Refill: 3 -     Levothyroxine Sodium; TAKE 1 TABLET BY MOUTH ONCE DAILY BEFOREBREAKFAST  Dispense: 90 tablet; Refill: 2 -     Losartan Potassium; Take 1 tablet (100 mg total) by mouth daily.  Dispense: 90 tablet; Refill: 1     Einar Pheasant, MD

## 2022-08-31 ENCOUNTER — Encounter: Payer: Self-pay | Admitting: Internal Medicine

## 2022-08-31 NOTE — Assessment & Plan Note (Signed)
Low carb diet and exercise.  Follow met b and check A1c next labs.  

## 2022-08-31 NOTE — Assessment & Plan Note (Signed)
GFR 47 08/27/22.  Continue losartan.  Avoid antiinflammatories.  Stay hydrated.  Follow metabolic panel.

## 2022-08-31 NOTE — Assessment & Plan Note (Signed)
Discussed memory.  Continue oral B12. She reports husband has noted - repeating herself.  Will refer to neurology for formal evaluation.

## 2022-08-31 NOTE — Assessment & Plan Note (Signed)
On thyroid replacement.  Follow tsh.  

## 2022-08-31 NOTE — Assessment & Plan Note (Signed)
On zetia and now on lipitor three days per week. Tolerating.  Doing well.  Cholesterol improved.  Continue current medication regimen.  Follow lipid panel and liver function tests.   Lab Results  Component Value Date   CHOL 193 08/28/2022   HDL 79.10 08/28/2022   LDLCALC 91 08/28/2022   TRIG 118.0 08/28/2022   CHOLHDL 2 08/28/2022

## 2022-08-31 NOTE — Assessment & Plan Note (Signed)
Evaluated by [facility] (Dr Chen).   Follow pressure and follow metabolic panel.  

## 2022-08-31 NOTE — Assessment & Plan Note (Signed)
On losartan and amlodipine. Blood pressures as outlined.  Continue current medication regimen.  Follow pressures.  Follow metabolic panel.  

## 2022-09-26 ENCOUNTER — Other Ambulatory Visit: Payer: Medicare HMO

## 2022-11-01 DIAGNOSIS — Z1159 Encounter for screening for other viral diseases: Secondary | ICD-10-CM | POA: Diagnosis not present

## 2022-11-01 DIAGNOSIS — F5101 Primary insomnia: Secondary | ICD-10-CM | POA: Diagnosis not present

## 2022-11-01 DIAGNOSIS — E538 Deficiency of other specified B group vitamins: Secondary | ICD-10-CM | POA: Diagnosis not present

## 2022-11-01 DIAGNOSIS — G3184 Mild cognitive impairment, so stated: Secondary | ICD-10-CM | POA: Diagnosis not present

## 2022-11-08 ENCOUNTER — Other Ambulatory Visit (HOSPITAL_COMMUNITY): Payer: Self-pay | Admitting: Neurology

## 2022-11-08 DIAGNOSIS — G3184 Mild cognitive impairment, so stated: Secondary | ICD-10-CM

## 2022-11-19 ENCOUNTER — Ambulatory Visit (HOSPITAL_COMMUNITY)
Admission: RE | Admit: 2022-11-19 | Discharge: 2022-11-19 | Disposition: A | Discharge status: Home / Self Care | Payer: Medicare HMO | Source: Ambulatory Visit | Attending: Neurology | Admitting: Neurology

## 2022-11-19 DIAGNOSIS — G3184 Mild cognitive impairment, so stated: Secondary | ICD-10-CM | POA: Insufficient documentation

## 2022-12-04 ENCOUNTER — Telehealth: Payer: Self-pay

## 2022-12-04 NOTE — Patient Outreach (Signed)
  Care Coordination   Initial Visit Note   12/04/2022 Name: Shelby Spencer MRN: [id number] DOB: 27-Dec-1945  Shelby Spencer is a 77 y.o. year old female who sees Dale Jackson [place], MD for primary care. I spoke with  [name] by phone today.  What matters to the patients health and wellness today?  Patient denies having nursing and / or community resource needs.  Verbalized appreciation for call.     Goals Addressed             This Visit's Progress    COMPLETED: Care coordination activities - no follow up needed.       Interventions Today    Flowsheet Row Most Recent Value  General Interventions   General Interventions Discussed/Reviewed General Interventions Discussed  [Care coordination services discussed. SDOH survey completed. AWV discussed . Advised to contact provider office to scheduled..  Encouraged to get vaccines.. Advised to contact PCP office if care coordination services needed in the future.]              SDOH assessments and interventions completed:  Yes  SDOH Interventions Today    Flowsheet Row Most Recent Value  SDOH Interventions   Food Insecurity Interventions Intervention Not Indicated  Housing Interventions Intervention Not Indicated  Transportation Interventions Intervention Not Indicated        Care Coordination Interventions:  Yes, provided   Follow up plan: No further intervention required.   Encounter Outcome:  Pt. Visit Completed   George Ina RN,BSN,CCM [facility] Care Coordination [phone] direct line

## 2022-12-24 DIAGNOSIS — H26493 Other secondary cataract, bilateral: Secondary | ICD-10-CM | POA: Diagnosis not present

## 2022-12-25 ENCOUNTER — Other Ambulatory Visit (INDEPENDENT_AMBULATORY_CARE_PROVIDER_SITE_OTHER): Payer: Medicare HMO

## 2022-12-25 DIAGNOSIS — R739 Hyperglycemia, unspecified: Secondary | ICD-10-CM | POA: Diagnosis not present

## 2022-12-25 DIAGNOSIS — I10 Essential (primary) hypertension: Secondary | ICD-10-CM | POA: Diagnosis not present

## 2022-12-25 DIAGNOSIS — N1831 Chronic kidney disease, stage 3a: Secondary | ICD-10-CM

## 2022-12-25 DIAGNOSIS — E7800 Pure hypercholesterolemia, unspecified: Secondary | ICD-10-CM

## 2022-12-25 LAB — CBC WITH DIFFERENTIAL/PLATELET
Basophils Absolute: 0 10*3/uL (ref 0.0–0.1)
Basophils Relative: 0.9 % (ref 0.0–3.0)
Eosinophils Absolute: 0.2 10*3/uL (ref 0.0–0.7)
Eosinophils Relative: 4.6 % (ref 0.0–5.0)
HCT: 41.5 % (ref 36.0–46.0)
Hemoglobin: 13.5 g/dL (ref 12.0–15.0)
Lymphocytes Relative: 44.8 % (ref 12.0–46.0)
Lymphs Abs: 2.4 10*3/uL (ref 0.7–4.0)
MCHC: 32.4 g/dL (ref 30.0–36.0)
MCV: 93.5 fl (ref 78.0–100.0)
Monocytes Absolute: 0.5 10*3/uL (ref 0.1–1.0)
Monocytes Relative: 10.1 % (ref 3.0–12.0)
Neutro Abs: 2.1 10*3/uL (ref 1.4–7.7)
Neutrophils Relative %: 39.6 % — ABNORMAL LOW (ref 43.0–77.0)
Platelets: 346 10*3/uL (ref 150.0–400.0)
RBC: 4.45 Mil/uL (ref 3.87–5.11)
RDW: 14 % (ref 11.5–15.5)
WBC: 5.3 10*3/uL (ref 4.0–10.5)

## 2022-12-25 LAB — HEPATIC FUNCTION PANEL
ALT: 13 U/L (ref 0–35)
AST: 13 U/L (ref 0–37)
Albumin: 4.4 g/dL (ref 3.5–5.2)
Alkaline Phosphatase: 62 U/L (ref 39–117)
Bilirubin, Direct: 0.1 mg/dL (ref 0.0–0.3)
Total Bilirubin: 0.6 mg/dL (ref 0.2–1.2)
Total Protein: 7 g/dL (ref 6.0–8.3)

## 2022-12-25 LAB — BASIC METABOLIC PANEL
BUN: 25 mg/dL — ABNORMAL HIGH (ref 6–23)
CO2: 25 mEq/L (ref 19–32)
Calcium: 9.8 mg/dL (ref 8.4–10.5)
Chloride: 104 mEq/L (ref 96–112)
Creatinine, Ser: 1.28 mg/dL — ABNORMAL HIGH (ref 0.40–1.20)
GFR: 40.83 mL/min — ABNORMAL LOW (ref >60.00)
Glucose, Bld: 106 mg/dL — ABNORMAL HIGH (ref 70–99)
Potassium: 4.9 mEq/L (ref 3.5–5.1)
Sodium: 138 mEq/L (ref 135–145)

## 2022-12-25 LAB — TSH: TSH: 4.11 u[IU]/mL (ref 0.35–5.50)

## 2022-12-25 LAB — LIPID PANEL
Cholesterol: 196 mg/dL (ref 0–200)
HDL: 73.5 mg/dL (ref >39.00)
LDL Cholesterol: 106 mg/dL — ABNORMAL HIGH (ref 0–99)
NonHDL: 122.92
Total CHOL/HDL Ratio: 3
Triglycerides: 83 mg/dL (ref 0.0–149.0)
VLDL: 16.6 mg/dL (ref 0.0–40.0)

## 2022-12-25 LAB — HEMOGLOBIN A1C: Hgb A1c MFr Bld: 5.6 % (ref 4.6–6.5)

## 2022-12-30 ENCOUNTER — Ambulatory Visit (INDEPENDENT_AMBULATORY_CARE_PROVIDER_SITE_OTHER): Payer: Medicare HMO | Admitting: Internal Medicine

## 2022-12-30 ENCOUNTER — Encounter: Payer: Self-pay | Admitting: Internal Medicine

## 2022-12-30 VITALS — BP 118/70 | HR 73 | Temp 97.9°F | Resp 16 | Ht 68.0 in | Wt 173.6 lb

## 2022-12-30 DIAGNOSIS — Z86010 Personal history of colonic polyps: Secondary | ICD-10-CM | POA: Diagnosis not present

## 2022-12-30 DIAGNOSIS — I10 Essential (primary) hypertension: Secondary | ICD-10-CM | POA: Diagnosis not present

## 2022-12-30 DIAGNOSIS — E7800 Pure hypercholesterolemia, unspecified: Secondary | ICD-10-CM

## 2022-12-30 DIAGNOSIS — R413 Other amnesia: Secondary | ICD-10-CM

## 2022-12-30 DIAGNOSIS — N1831 Chronic kidney disease, stage 3a: Secondary | ICD-10-CM

## 2022-12-30 DIAGNOSIS — R739 Hyperglycemia, unspecified: Secondary | ICD-10-CM | POA: Diagnosis not present

## 2022-12-30 DIAGNOSIS — E039 Hypothyroidism, unspecified: Secondary | ICD-10-CM | POA: Diagnosis not present

## 2022-12-30 DIAGNOSIS — Z1211 Encounter for screening for malignant neoplasm of colon: Secondary | ICD-10-CM

## 2022-12-30 MED ORDER — LEVOTHYROXINE SODIUM 50 MCG PO TABS: ORAL_TABLET | ORAL | 2 refills | Status: DC

## 2022-12-30 MED ORDER — AMLODIPINE BESYLATE 2.5 MG PO TABS: 2.5000 mg | ORAL_TABLET | Freq: Two times a day (BID) | ORAL | 1 refills | Status: DC

## 2022-12-30 MED ORDER — LOSARTAN POTASSIUM 100 MG PO TABS: 100.0000 mg | ORAL_TABLET | Freq: Every day | ORAL | 1 refills | Status: DC

## 2022-12-30 NOTE — Progress Notes (Unsigned)
Subjective:    Patient ID: Shelby Spencer, female    DOB: 1945-11-07, 77 y.o.   MRN: [id number]  Patient here for  Chief Complaint  Patient presents with   Medical Management of Chronic Issues    HPI Here for follow up regarding hypercholesterolemia, hypothyroidism and hypertension.  Evaluated by Dr Sherryll Burger 11/01/22 - [facility]. Low B12 level - instructed to take oral B12. MRI - No acute intracranial process. Cerebral volume is within normal limits for age. No disproportionate lobar atrophy. Discussed above today.  She reports she is doing well.  Feels good.  Staying active.  No chest pain or sob reported.  No abdominal pain or bowel change reported.     Past Medical History:  Diagnosis Date   Cataract    CKD (chronic kidney disease), stage III (HCC)    History of colon polyps    History of prolapse of bladder    History of pyloric stenosis    Hypercholesterolemia    Hypertension    Thyroid disease    Past Surgical History:  Procedure Laterality Date   CATARACT EXTRACTION, BILATERAL     TONSILLECTOMY     TUBAL LIGATION     Family History  Problem Relation Age of Onset   Hypertension Mother    Heart disease Father    Heart attack Maternal Grandfather    Pancreatic cancer Neg Hx    Rectal cancer Neg Hx    Stomach cancer Neg Hx    Social History   Socioeconomic History   Marital status: Married    Spouse name: Not on file   Number of children: Not on file   Years of education: Not on file   Highest education level: Not on file  Occupational History   Not on file  Tobacco Use   Smoking status: Never   Smokeless tobacco: Never  Vaping Use   Vaping status: Never Used  Substance and Sexual Activity   Alcohol use: Yes    Alcohol/week: 5.0 standard drinks of alcohol    Types: 5 Glasses of wine per week    Comment: per week   Drug use: No   Sexual activity: Not Currently    Partners: Male    Comment: 1st intercourse-21, partners- 2  Other Topics Concern   Not on file   Social History Narrative   Not on file   Social Determinants of Health   Financial Resource Strain: Low Risk  (06/11/2021)   Overall Financial Resource Strain (CARDIA)    Difficulty of Paying Living Expenses: Not hard at all  Food Insecurity: No Food Insecurity (12/04/2022)   Hunger Vital Sign    Worried About Running Out of Food in the Last Year: Never true    Ran Out of Food in the Last Year: Never true  Transportation Needs: No Transportation Needs (12/04/2022)   PRAPARE - Administrator, Civil Service (Medical): No    Lack of Transportation (Non-Medical): No  Physical Activity: Not on file  Stress: Not on file  Social Connections: Not on file     Review of Systems  Constitutional:  Negative for appetite change and unexpected weight change.  HENT:  Negative for congestion and sinus pressure.   Respiratory:  Negative for cough, chest tightness and shortness of breath.   Cardiovascular:  Negative for chest pain, palpitations and leg swelling.  Gastrointestinal:  Negative for abdominal pain, diarrhea, nausea and vomiting.  Genitourinary:  Negative for difficulty urinating and dysuria.  Musculoskeletal:  Negative for joint swelling and myalgias.  Skin:  Negative for color change and rash.  Neurological:  Negative for dizziness and headaches.  Psychiatric/Behavioral:  Negative for agitation and dysphoric mood.        Objective:     BP 118/70   Pulse 73   Temp 97.9 F (36.6 C)   Resp 16   Ht 5\' 8"  (1.727 m)   Wt 173 lb 9.6 oz (78.7 kg)   SpO2 99%   BMI 26.40 kg/m  Wt Readings from Last 3 Encounters:  12/30/22 173 lb 9.6 oz (78.7 kg)  08/29/22 169 lb 6.4 oz (76.8 kg)  04/29/22 166 lb 3.2 oz (75.4 kg)    Physical Exam Vitals reviewed.  Constitutional:      General: She is not in acute distress.    Appearance: Normal appearance.  HENT:     Head: Normocephalic and atraumatic.     Right Ear: External ear normal.     Left Ear: External ear normal.  Eyes:      General: No scleral icterus.       Right eye: No discharge.        Left eye: No discharge.     Conjunctiva/sclera: Conjunctivae normal.  Neck:     Thyroid: No thyromegaly.  Cardiovascular:     Rate and Rhythm: Normal rate and regular rhythm.  Pulmonary:     Effort: No respiratory distress.     Breath sounds: Normal breath sounds. No wheezing.  Abdominal:     General: Bowel sounds are normal.     Palpations: Abdomen is soft.     Tenderness: There is no abdominal tenderness.  Musculoskeletal:        General: No swelling or tenderness.     Cervical back: Neck supple. No tenderness.  Lymphadenopathy:     Cervical: No cervical adenopathy.  Skin:    Findings: No erythema or rash.  Neurological:     Mental Status: She is alert.  Psychiatric:        Mood and Affect: Mood normal.        Behavior: Behavior normal.      Outpatient Encounter Medications as of 12/30/2022  Medication Sig   amLODipine (NORVASC) 2.5 MG tablet Take 1 tablet (2.5 mg total) by mouth 2 (two) times daily.   atorvastatin (LIPITOR) 10 MG tablet Take 1 tablet (10 mg total) by mouth 2 (two) times a week. TAKE 1 TABLET BY MOUTH 2 DAYS A WEEK   Calcium Carbonate-Vitamin D (CALCIUM + D PO) Take by mouth daily at 8 pm.   doxycycline (PERIOSTAT) 20 MG tablet Take 20 mg by mouth 2 (two) times daily.    ezetimibe (ZETIA) 10 MG tablet Take 1 tablet (10 mg total) by mouth daily.   levothyroxine (SYNTHROID) 50 MCG tablet TAKE 1 TABLET BY MOUTH ONCE DAILY BEFOREBREAKFAST   losartan (COZAAR) 100 MG tablet Take 1 tablet (100 mg total) by mouth daily.   [DISCONTINUED] amLODipine (NORVASC) 2.5 MG tablet TAKE 1 TABLET BY MOUTH TWICE DAILY   [DISCONTINUED] levothyroxine (SYNTHROID) 50 MCG tablet TAKE 1 TABLET BY MOUTH ONCE DAILY BEFOREBREAKFAST   [DISCONTINUED] losartan (COZAAR) 100 MG tablet Take 1 tablet (100 mg total) by mouth daily.   No facility-administered encounter medications on file as of 12/30/2022.     Lab  Results  Component Value Date   WBC 5.3 12/25/2022   HGB 13.5 12/25/2022   HCT 41.5 12/25/2022   PLT 346.0 12/25/2022   GLUCOSE 106 (H)  12/25/2022   CHOL 196 12/25/2022   TRIG 83.0 12/25/2022   HDL 73.50 12/25/2022   LDLCALC 106 (H) 12/25/2022   ALT 13 12/25/2022   AST 13 12/25/2022   NA 138 12/25/2022   K 4.9 12/25/2022   CL 104 12/25/2022   CREATININE 1.28 (H) 12/25/2022   BUN 25 (H) 12/25/2022   CO2 25 12/25/2022   TSH 4.11 12/25/2022   HGBA1C 5.6 12/25/2022    MR BRAIN WO CONTRAST  Result Date: 11/26/2022 CLINICAL DATA:  Mild cognitive impairment EXAM: MRI HEAD WITHOUT CONTRAST TECHNIQUE: Multiplanar, multiecho pulse sequences of the brain and surrounding structures were obtained without intravenous contrast. COMPARISON:  None Available. FINDINGS: Brain: No restricted diffusion to suggest acute or subacute infarct. No acute hemorrhage, mass, mass effect, or midline shift. No hydrocephalus or extra-axial collection. Normal pituitary and craniocervical junction. No hemosiderin deposition to suggest remote hemorrhage or superficial siderosis. Cerebral volume is within normal limits for age. No disproportionate lobar atrophy. No foci of T2 hyperintense signal in the periventricular white matter to suggest chronic small vessel ischemic disease. Vascular: Normal arterial flow voids. Skull and upper cervical spine: Normal marrow signal. Sinuses/Orbits: Clear paranasal sinuses. No acute finding in the orbits. Status post bilateral lens replacements. Other: Small amount of fluid in the right mastoid air cells. IMPRESSION: No acute intracranial process. Cerebral volume is within normal limits for age. No disproportionate lobar atrophy. Electronically Signed   By: Wiliam Ke M.D.   On: 11/26/2022 20:36       Assessment & Plan:  Colon cancer screening -     Ambulatory referral to Gastroenterology  Hypercholesterolemia Assessment & Plan: On zetia and now on lipitor. Tolerating.  Doing  well.  Cholesterol improved.  Continue current medication regimen.  Follow lipid panel and liver function tests.   Lab Results  Component Value Date   CHOL 196 12/25/2022   HDL 73.50 12/25/2022   LDLCALC 106 (H) 12/25/2022   TRIG 83.0 12/25/2022   CHOLHDL 3 12/25/2022    Orders: -     Lipid panel; Future -     Hepatic function panel; Future -     Basic metabolic panel; Future  Hyperglycemia Assessment & Plan: Low carb diet and exercise.  Follow met b and check A1c next labs.   Orders: -     Hemoglobin A1c; Future  Stage 3a chronic kidney disease (HCC) Assessment & Plan: GFR 40 - 12/2022.  Continue losartan.  Avoid antiinflammatories.  Stay hydrated.  Follow metabolic panel.   Orders: -     Basic metabolic panel; Future  History of colonic polyps Assessment & Plan: Colonoscopy 02/2017.  Dr Marina Goodell.  Due f/u colonoscopy.    Hypertension, essential Assessment & Plan: On losartan and amlodipine. Blood pressures as outlined.  Continue current medication regimen.  Follow pressures.  Follow metabolic panel.    Hypothyroidism, unspecified type Assessment & Plan: On thyroid replacement.  Follow tsh.    Memory change Assessment & Plan: Neurology 11/2022 - [facility] profile.  MRI with neuroquant   Other orders -     amLODIPine Besylate; Take 1 tablet (2.5 mg total) by mouth 2 (two) times daily.  Dispense: 180 tablet; Refill: 1 -     Levothyroxine Sodium; TAKE 1 TABLET BY MOUTH ONCE DAILY BEFOREBREAKFAST  Dispense: 90 tablet; Refill: 2 -     Losartan Potassium; Take 1 tablet (100 mg total) by mouth daily.  Dispense: 90 tablet; Refill: 1     Dale Chenoweth, MD

## 2022-12-30 NOTE — Patient Instructions (Signed)
Continue to avoid antiinflammatory medication ( aleve, ibuprofen and advil).    Continue to stay hydrated.

## 2023-01-01 ENCOUNTER — Encounter: Payer: Self-pay | Admitting: Internal Medicine

## 2023-01-01 NOTE — Assessment & Plan Note (Signed)
On thyroid replacement.  Follow tsh.  

## 2023-01-01 NOTE — Assessment & Plan Note (Signed)
Colonoscopy 02/2017.  Dr Marina Goodell.  Due f/u colonoscopy.

## 2023-01-01 NOTE — Assessment & Plan Note (Signed)
Low carb diet and exercise.  Follow met b and check A1c next labs.  

## 2023-01-01 NOTE — Assessment & Plan Note (Signed)
Neurology 11/2022 - [facility] profile.  MRI with neuroquant

## 2023-01-01 NOTE — Assessment & Plan Note (Signed)
GFR 40 - 12/2022.  Continue losartan.  Avoid antiinflammatories.  Stay hydrated.  Follow metabolic panel.

## 2023-01-01 NOTE — Assessment & Plan Note (Signed)
On zetia and now on lipitor. Tolerating.  Doing well.  Cholesterol improved.  Continue current medication regimen.  Follow lipid panel and liver function tests.   Lab Results  Component Value Date   CHOL 196 12/25/2022   HDL 73.50 12/25/2022   LDLCALC 106 (H) 12/25/2022   TRIG 83.0 12/25/2022   CHOLHDL 3 12/25/2022

## 2023-01-01 NOTE — Assessment & Plan Note (Signed)
On losartan and amlodipine. Blood pressures as outlined.  Continue current medication regimen.  Follow pressures.  Follow metabolic panel.  

## 2023-01-20 ENCOUNTER — Other Ambulatory Visit: Payer: Medicare HMO

## 2023-03-03 DIAGNOSIS — F0280 Dementia in other diseases classified elsewhere without behavioral disturbance: Secondary | ICD-10-CM | POA: Diagnosis not present

## 2023-03-03 DIAGNOSIS — G309 Alzheimer's disease, unspecified: Secondary | ICD-10-CM | POA: Diagnosis not present

## 2023-03-03 DIAGNOSIS — G3184 Mild cognitive impairment, so stated: Secondary | ICD-10-CM | POA: Diagnosis not present

## 2023-03-03 DIAGNOSIS — F5101 Primary insomnia: Secondary | ICD-10-CM | POA: Diagnosis not present

## 2023-05-05 ENCOUNTER — Ambulatory Visit: Payer: Medicare HMO | Admitting: Internal Medicine

## 2023-05-06 ENCOUNTER — Other Ambulatory Visit (INDEPENDENT_AMBULATORY_CARE_PROVIDER_SITE_OTHER): Payer: Medicare HMO

## 2023-05-06 DIAGNOSIS — N1831 Chronic kidney disease, stage 3a: Secondary | ICD-10-CM | POA: Diagnosis not present

## 2023-05-06 DIAGNOSIS — R739 Hyperglycemia, unspecified: Secondary | ICD-10-CM | POA: Diagnosis not present

## 2023-05-06 DIAGNOSIS — E7800 Pure hypercholesterolemia, unspecified: Secondary | ICD-10-CM

## 2023-05-06 LAB — BASIC METABOLIC PANEL
BUN: 21 mg/dL (ref 6–23)
CO2: 28 meq/L (ref 19–32)
Calcium: 9.5 mg/dL (ref 8.4–10.5)
Chloride: 107 meq/L (ref 96–112)
Creatinine, Ser: 1.13 mg/dL (ref 0.40–1.20)
GFR: 47.29 mL/min — ABNORMAL LOW (ref >60.00)
Glucose, Bld: 101 mg/dL — ABNORMAL HIGH (ref 70–99)
Potassium: 4.9 meq/L (ref 3.5–5.1)
Sodium: 141 meq/L (ref 135–145)

## 2023-05-06 LAB — HEPATIC FUNCTION PANEL
ALT: 17 U/L (ref 0–35)
AST: 14 U/L (ref 0–37)
Albumin: 4.3 g/dL (ref 3.5–5.2)
Alkaline Phosphatase: 76 U/L (ref 39–117)
Bilirubin, Direct: 0.1 mg/dL (ref 0.0–0.3)
Total Bilirubin: 0.7 mg/dL (ref 0.2–1.2)
Total Protein: 7.1 g/dL (ref 6.0–8.3)

## 2023-05-06 LAB — LIPID PANEL
Cholesterol: 222 mg/dL — ABNORMAL HIGH (ref 0–200)
HDL: 66.6 mg/dL (ref >39.00)
LDL Cholesterol: 129 mg/dL — ABNORMAL HIGH (ref 0–99)
NonHDL: 155.66
Total CHOL/HDL Ratio: 3
Triglycerides: 133 mg/dL (ref 0.0–149.0)
VLDL: 26.6 mg/dL (ref 0.0–40.0)

## 2023-05-06 LAB — HEMOGLOBIN A1C: Hgb A1c MFr Bld: 5.7 % (ref 4.6–6.5)

## 2023-05-08 ENCOUNTER — Ambulatory Visit (INDEPENDENT_AMBULATORY_CARE_PROVIDER_SITE_OTHER): Payer: Medicare HMO | Admitting: Internal Medicine

## 2023-05-08 VITALS — BP 126/70 | HR 85 | Temp 98.2°F | Resp 16 | Ht 68.0 in | Wt 177.4 lb

## 2023-05-08 DIAGNOSIS — E039 Hypothyroidism, unspecified: Secondary | ICD-10-CM

## 2023-05-08 DIAGNOSIS — Z1211 Encounter for screening for malignant neoplasm of colon: Secondary | ICD-10-CM

## 2023-05-08 DIAGNOSIS — N1831 Chronic kidney disease, stage 3a: Secondary | ICD-10-CM | POA: Diagnosis not present

## 2023-05-08 DIAGNOSIS — Z860100 Personal history of colon polyps, unspecified: Secondary | ICD-10-CM | POA: Diagnosis not present

## 2023-05-08 DIAGNOSIS — R413 Other amnesia: Secondary | ICD-10-CM

## 2023-05-08 DIAGNOSIS — R739 Hyperglycemia, unspecified: Secondary | ICD-10-CM | POA: Diagnosis not present

## 2023-05-08 DIAGNOSIS — I701 Atherosclerosis of renal artery: Secondary | ICD-10-CM

## 2023-05-08 DIAGNOSIS — I10 Essential (primary) hypertension: Secondary | ICD-10-CM

## 2023-05-08 DIAGNOSIS — E7800 Pure hypercholesterolemia, unspecified: Secondary | ICD-10-CM | POA: Diagnosis not present

## 2023-05-08 MED ORDER — ATORVASTATIN CALCIUM 10 MG PO TABS: ORAL_TABLET | ORAL | 3 refills | Status: DC

## 2023-05-08 NOTE — Progress Notes (Signed)
Subjective:    Patient ID: Shelby Spencer, female    DOB: Aug 22, 1945, 77 y.o.   MRN: [id number]  Patient here for  Chief Complaint  Patient presents with   Medical Management of Chronic Issues    HPI Here for follow up regarding hypercholesterolemia, hypothyroidism and hypertension.   Evaluated by Dr Sherryll Burger 11/01/22 - [facility]. Low B12 level - instructed to take oral B12. MRI - No acute intracranial process. Cerebral volume is within normal limits for age. No disproportionate lobar atrophy. Had f/u 03/03/23 - no changes made. She reports her memory is stable. No significant change.  Tries to stay active. No chest pain or sob reported. No abdominal pain or bowel change reported. Discussed labs.  Discussed - due colonoscopy.   Past Medical History:  Diagnosis Date   Cataract    CKD (chronic kidney disease), stage III (HCC)    History of colon polyps    History of prolapse of bladder    History of pyloric stenosis    Hypercholesterolemia    Hypertension    Thyroid disease    Past Surgical History:  Procedure Laterality Date   CATARACT EXTRACTION, BILATERAL     TONSILLECTOMY     TUBAL LIGATION     Family History  Problem Relation Age of Onset   Hypertension Mother    Heart disease Father    Heart attack Maternal Grandfather    Pancreatic cancer Neg Hx    Rectal cancer Neg Hx    Stomach cancer Neg Hx    Social History   Socioeconomic History   Marital status: Married    Spouse name: Not on file   Number of children: Not on file   Years of education: Not on file   Highest education level: Doctorate  Occupational History   Not on file  Tobacco Use   Smoking status: Never   Smokeless tobacco: Never  Vaping Use   Vaping status: Never Used  Substance and Sexual Activity   Alcohol use: Yes    Alcohol/week: 5.0 standard drinks of alcohol    Types: 5 Glasses of wine per week    Comment: per week   Drug use: No   Sexual activity: Not Currently    Partners: Male    Comment: 1st  intercourse-21, partners- 2  Other Topics Concern   Not on file  Social History Narrative   Not on file   Social Determinants of Health   Financial Resource Strain: Low Risk  (05/07/2023)   Overall Financial Resource Strain (CARDIA)    Difficulty of Paying Living Expenses: Not hard at all  Food Insecurity: No Food Insecurity (05/07/2023)   Hunger Vital Sign    Worried About Running Out of Food in the Last Year: Never true    Ran Out of Food in the Last Year: Never true  Transportation Needs: No Transportation Needs (05/07/2023)   PRAPARE - Administrator, Civil Service (Medical): No    Lack of Transportation (Non-Medical): No  Physical Activity: Insufficiently Active (05/07/2023)   Exercise Vital Sign    Days of Exercise per Week: 2 days    Minutes of Exercise per Session: 20 min  Stress: No Stress Concern Present (05/07/2023)   [employer] of Occupational Health - Occupational Stress Questionnaire    Feeling of Stress : Not at all  Social Connections: Socially Integrated (05/07/2023)   Social Connection and Isolation Panel [NHANES]    Frequency of Communication with Friends and Family: More  than three times a week    Frequency of Social Gatherings with Friends and Family: Twice a week    Attends Religious Services: More than 4 times per year    Active Member of [employer] or Organizations: Yes    Attends Engineer, structural: More than 4 times per year    Marital Status: Married     Review of Systems  Constitutional:  Negative for appetite change and unexpected weight change.  HENT:  Negative for congestion and sinus pressure.   Respiratory:  Negative for cough, chest tightness and shortness of breath.   Cardiovascular:  Negative for chest pain and palpitations.  Gastrointestinal:  Negative for abdominal pain, diarrhea, nausea and vomiting.  Genitourinary:  Negative for difficulty urinating and dysuria.  Musculoskeletal:  Negative for joint swelling and  myalgias.  Skin:  Negative for color change and rash.  Neurological:  Negative for dizziness and headaches.  Psychiatric/Behavioral:  Negative for agitation and dysphoric mood.        Objective:     BP 126/70   Pulse 85   Temp 98.2 F (36.8 C)   Resp 16   Ht 5\' 8"  (1.727 m)   Wt 177 lb 6.4 oz (80.5 kg)   SpO2 98%   BMI 26.97 kg/m  Wt Readings from Last 3 Encounters:  05/08/23 177 lb 6.4 oz (80.5 kg)  12/30/22 173 lb 9.6 oz (78.7 kg)  08/29/22 169 lb 6.4 oz (76.8 kg)    Physical Exam Vitals reviewed.  Constitutional:      General: She is not in acute distress.    Appearance: Normal appearance.  HENT:     Head: Normocephalic and atraumatic.     Right Ear: External ear normal.     Left Ear: External ear normal.  Eyes:     General: No scleral icterus.       Right eye: No discharge.        Left eye: No discharge.     Conjunctiva/sclera: Conjunctivae normal.  Neck:     Thyroid: No thyromegaly.  Cardiovascular:     Rate and Rhythm: Normal rate and regular rhythm.  Pulmonary:     Effort: No respiratory distress.     Breath sounds: Normal breath sounds. No wheezing.  Abdominal:     General: Bowel sounds are normal.     Palpations: Abdomen is soft.     Tenderness: There is no abdominal tenderness.  Musculoskeletal:        General: No swelling or tenderness.     Cervical back: Neck supple. No tenderness.  Lymphadenopathy:     Cervical: No cervical adenopathy.  Skin:    Findings: No erythema or rash.  Neurological:     Mental Status: She is alert.  Psychiatric:        Mood and Affect: Mood normal.        Behavior: Behavior normal.      Outpatient Encounter Medications as of 05/08/2023  Medication Sig   amLODipine (NORVASC) 2.5 MG tablet Take 1 tablet (2.5 mg total) by mouth 2 (two) times daily.   atorvastatin (LIPITOR) 10 MG tablet TAKE 1 TABLET BY MOUTH 3 DAYS A WEEK   Calcium Carbonate-Vitamin D (CALCIUM + D PO) Take by mouth daily at 8 pm.   doxycycline  (PERIOSTAT) 20 MG tablet Take 20 mg by mouth 2 (two) times daily.    ezetimibe (ZETIA) 10 MG tablet Take 1 tablet (10 mg total) by mouth daily.   levothyroxine (SYNTHROID)  50 MCG tablet TAKE 1 TABLET BY MOUTH ONCE DAILY BEFOREBREAKFAST   losartan (COZAAR) 100 MG tablet Take 1 tablet (100 mg total) by mouth daily.   [DISCONTINUED] atorvastatin (LIPITOR) 10 MG tablet Take 1 tablet (10 mg total) by mouth 2 (two) times a week. TAKE 1 TABLET BY MOUTH 2 DAYS A WEEK   No facility-administered encounter medications on file as of 05/08/2023.     Lab Results  Component Value Date   WBC 5.3 12/25/2022   HGB 13.5 12/25/2022   HCT 41.5 12/25/2022   PLT 346.0 12/25/2022   GLUCOSE 101 (H) 05/06/2023   CHOL 222 (H) 05/06/2023   TRIG 133.0 05/06/2023   HDL 66.60 05/06/2023   LDLCALC 129 (H) 05/06/2023   ALT 17 05/06/2023   AST 14 05/06/2023   NA 141 05/06/2023   K 4.9 05/06/2023   CL 107 05/06/2023   CREATININE 1.13 05/06/2023   BUN 21 05/06/2023   CO2 28 05/06/2023   TSH 4.11 12/25/2022   HGBA1C 5.7 05/06/2023    MR BRAIN WO CONTRAST  Result Date: 11/26/2022 CLINICAL DATA:  Mild cognitive impairment EXAM: MRI HEAD WITHOUT CONTRAST TECHNIQUE: Multiplanar, multiecho pulse sequences of the brain and surrounding structures were obtained without intravenous contrast. COMPARISON:  None Available. FINDINGS: Brain: No restricted diffusion to suggest acute or subacute infarct. No acute hemorrhage, mass, mass effect, or midline shift. No hydrocephalus or extra-axial collection. Normal pituitary and craniocervical junction. No hemosiderin deposition to suggest remote hemorrhage or superficial siderosis. Cerebral volume is within normal limits for age. No disproportionate lobar atrophy. No foci of T2 hyperintense signal in the periventricular white matter to suggest chronic small vessel ischemic disease. Vascular: Normal arterial flow voids. Skull and upper cervical spine: Normal marrow signal. Sinuses/Orbits:  Clear paranasal sinuses. No acute finding in the orbits. Status post bilateral lens replacements. Other: Small amount of fluid in the right mastoid air cells. IMPRESSION: No acute intracranial process. Cerebral volume is within normal limits for age. No disproportionate lobar atrophy. Electronically Signed   By: Wiliam Ke M.D.   On: 11/26/2022 20:36       Assessment & Plan:  Colon cancer screening -     Ambulatory referral to Gastroenterology  Renal artery stenosis [facility] Assessment & Plan: Evaluated by [facility] (Dr Imogene Burn).   Follow pressure and follow metabolic panel.    Memory change Assessment & Plan: Evaluated by Dr Sherryll Burger 11/01/22 - [facility]. Low B12 level - instructed to take oral B12. MRI - No acute intracranial process. Cerebral volume is within normal limits for age. No disproportionate lobar atrophy. Had f/u 03/03/23 - no changes made. She reports her memory is stable. No significant change.    Hypothyroidism, unspecified type Assessment & Plan: On thyroid replacement.  Follow tsh.    Hypertension, essential Assessment & Plan: On losartan and amlodipine. Blood pressures as outlined.  Continue current medication regimen.  Follow pressures.  Follow metabolic panel.    Hyperglycemia Assessment & Plan: Low carb diet and exercise.  Follow met b and check A1c next labs.  Lab Results  Component Value Date   HGBA1C 5.7 05/06/2023      Hypercholesterolemia Assessment & Plan: On zetia and now on lipitor. Tolerating. Agreeable to increase lipitor to 3 days per week.  Doing well.  Cholesterol improved.  Continue current medication regimen.  Follow lipid panel and liver function tests.   Lab Results  Component Value Date   CHOL 222 (H) 05/06/2023   HDL  66.60 05/06/2023   LDLCALC 129 (H) 05/06/2023   TRIG 133.0 05/06/2023   CHOLHDL 3 05/06/2023     History of colonic polyps Assessment & Plan: Colonoscopy 02/2017.  Dr Marina Goodell.  Due f/u colonoscopy. Discussed.   Agreeable for referral.    Stage 3a chronic kidney disease (HCC) Assessment & Plan: GFR 47 - 05/06/2023.  Improved. Continue losartan.  Avoid antiinflammatories.  Stay hydrated.  Follow metabolic panel.    Other orders -     Atorvastatin Calcium; TAKE 1 TABLET BY MOUTH 3 DAYS A WEEK  Dispense: 39 tablet; Refill: 3     Dale , MD

## 2023-05-08 NOTE — Patient Instructions (Addendum)
  Please remember to call and schedule your mammogram for next year (after 07/08/2022.)  [facility] [phone]  Increase lipitor to 3 days per week.

## 2023-05-11 ENCOUNTER — Encounter: Payer: Self-pay | Admitting: Internal Medicine

## 2023-05-11 NOTE — Assessment & Plan Note (Signed)
On thyroid replacement.  Follow tsh.  

## 2023-05-11 NOTE — Assessment & Plan Note (Addendum)
Low carb diet and exercise.  Follow met b and check A1c next labs.  Lab Results  Component Value Date   HGBA1C 5.7 05/06/2023

## 2023-05-11 NOTE — Assessment & Plan Note (Signed)
Colonoscopy 02/2017.  Dr Marina Goodell.  Due f/u colonoscopy. Discussed.  Agreeable for referral.

## 2023-05-11 NOTE — Assessment & Plan Note (Signed)
Evaluated by [facility] (Dr Chen).   Follow pressure and follow metabolic panel.  

## 2023-05-11 NOTE — Assessment & Plan Note (Addendum)
GFR 47 - 05/06/2023.  Improved. Continue losartan.  Avoid antiinflammatories.  Stay hydrated.  Follow metabolic panel.

## 2023-05-11 NOTE — Assessment & Plan Note (Signed)
Evaluated by Dr Sherryll Burger 11/01/22 - [facility]. Low B12 level - instructed to take oral B12. MRI - No acute intracranial process. Cerebral volume is within normal limits for age. No disproportionate lobar atrophy. Had f/u 03/03/23 - no changes made. She reports her memory is stable. No significant change.

## 2023-05-11 NOTE — Assessment & Plan Note (Addendum)
On zetia and now on lipitor. Tolerating. Agreeable to increase lipitor to 3 days per week.  Doing well.  Cholesterol improved.  Continue current medication regimen.  Follow lipid panel and liver function tests.   Lab Results  Component Value Date   CHOL 222 (H) 05/06/2023   HDL 66.60 05/06/2023   LDLCALC 129 (H) 05/06/2023   TRIG 133.0 05/06/2023   CHOLHDL 3 05/06/2023

## 2023-05-11 NOTE — Assessment & Plan Note (Signed)
On losartan and amlodipine. Blood pressures as outlined.  Continue current medication regimen.  Follow pressures.  Follow metabolic panel.  

## 2023-07-07 DIAGNOSIS — E538 Deficiency of other specified B group vitamins: Secondary | ICD-10-CM | POA: Diagnosis not present

## 2023-07-07 DIAGNOSIS — G3184 Mild cognitive impairment, so stated: Secondary | ICD-10-CM | POA: Diagnosis not present

## 2023-07-08 DIAGNOSIS — Z1231 Encounter for screening mammogram for malignant neoplasm of breast: Secondary | ICD-10-CM | POA: Diagnosis not present

## 2023-07-08 LAB — HM MAMMOGRAPHY

## 2023-08-02 DEATH — deceased

## 2023-09-08 ENCOUNTER — Other Ambulatory Visit (INDEPENDENT_AMBULATORY_CARE_PROVIDER_SITE_OTHER): Payer: Medicare HMO

## 2023-09-08 ENCOUNTER — Telehealth: Payer: Self-pay | Admitting: Internal Medicine

## 2023-09-08 DIAGNOSIS — E7800 Pure hypercholesterolemia, unspecified: Secondary | ICD-10-CM

## 2023-09-08 LAB — BASIC METABOLIC PANEL WITH GFR
BUN: 27 mg/dL — ABNORMAL HIGH (ref 6–23)
CO2: 26 meq/L (ref 19–32)
Calcium: 9.4 mg/dL (ref 8.4–10.5)
Chloride: 104 meq/L (ref 96–112)
Creatinine, Ser: 1.11 mg/dL (ref 0.40–1.20)
GFR: 48.2 mL/min — ABNORMAL LOW (ref >60.00)
Glucose, Bld: 111 mg/dL — ABNORMAL HIGH (ref 70–99)
Potassium: 4.1 meq/L (ref 3.5–5.1)
Sodium: 140 meq/L (ref 135–145)

## 2023-09-08 NOTE — Telephone Encounter (Signed)
 Please add lipid panel and liver panel to blood drawn 09/07/23.

## 2023-09-09 ENCOUNTER — Other Ambulatory Visit (INDEPENDENT_AMBULATORY_CARE_PROVIDER_SITE_OTHER)

## 2023-09-09 DIAGNOSIS — E7800 Pure hypercholesterolemia, unspecified: Secondary | ICD-10-CM

## 2023-09-09 LAB — LIPID PANEL
Cholesterol: 180 mg/dL (ref 0–200)
HDL: 64.5 mg/dL (ref >39.00)
LDL Cholesterol: 96 mg/dL (ref 0–99)
NonHDL: 115.31
Total CHOL/HDL Ratio: 3
Triglycerides: 97 mg/dL (ref 0.0–149.0)
VLDL: 19.4 mg/dL (ref 0.0–40.0)

## 2023-09-09 LAB — HEPATIC FUNCTION PANEL
ALT: 27 U/L (ref 0–35)
AST: 19 U/L (ref 0–37)
Albumin: 4.5 g/dL (ref 3.5–5.2)
Alkaline Phosphatase: 75 U/L (ref 39–117)
Bilirubin, Direct: 0.1 mg/dL (ref 0.0–0.3)
Total Bilirubin: 0.5 mg/dL (ref 0.2–1.2)
Total Protein: 7 g/dL (ref 6.0–8.3)

## 2023-09-09 NOTE — Telephone Encounter (Signed)
Add on sheet faxed 

## 2023-09-10 ENCOUNTER — Encounter: Payer: Self-pay | Admitting: Internal Medicine

## 2023-09-10 ENCOUNTER — Ambulatory Visit (INDEPENDENT_AMBULATORY_CARE_PROVIDER_SITE_OTHER): Payer: Medicare HMO | Admitting: Internal Medicine

## 2023-09-10 VITALS — BP 118/70 | HR 79 | Temp 98.0°F | Resp 16 | Ht 68.0 in | Wt 176.8 lb

## 2023-09-10 DIAGNOSIS — E7800 Pure hypercholesterolemia, unspecified: Secondary | ICD-10-CM

## 2023-09-10 DIAGNOSIS — R739 Hyperglycemia, unspecified: Secondary | ICD-10-CM | POA: Diagnosis not present

## 2023-09-10 DIAGNOSIS — N1831 Chronic kidney disease, stage 3a: Secondary | ICD-10-CM

## 2023-09-10 DIAGNOSIS — I701 Atherosclerosis of renal artery: Secondary | ICD-10-CM | POA: Diagnosis not present

## 2023-09-10 DIAGNOSIS — Z1211 Encounter for screening for malignant neoplasm of colon: Secondary | ICD-10-CM

## 2023-09-10 DIAGNOSIS — Z860100 Personal history of colon polyps, unspecified: Secondary | ICD-10-CM | POA: Diagnosis not present

## 2023-09-10 DIAGNOSIS — I10 Essential (primary) hypertension: Secondary | ICD-10-CM

## 2023-09-10 DIAGNOSIS — E039 Hypothyroidism, unspecified: Secondary | ICD-10-CM

## 2023-09-10 DIAGNOSIS — R413 Other amnesia: Secondary | ICD-10-CM

## 2023-09-10 LAB — POCT GLYCOSYLATED HEMOGLOBIN (HGB A1C): Hemoglobin A1C: 5.4 % (ref 4.0–5.6)

## 2023-09-10 MED ORDER — LEVOTHYROXINE SODIUM 50 MCG PO TABS: ORAL_TABLET | ORAL | 2 refills | Status: DC

## 2023-09-10 MED ORDER — AMLODIPINE BESYLATE 2.5 MG PO TABS: 2.5000 mg | ORAL_TABLET | Freq: Two times a day (BID) | ORAL | 1 refills | Status: DC

## 2023-09-10 MED ORDER — EZETIMIBE 10 MG PO TABS: 10.0000 mg | ORAL_TABLET | Freq: Every day | ORAL | 3 refills | Status: AC

## 2023-09-10 MED ORDER — LOSARTAN POTASSIUM 100 MG PO TABS: 100.0000 mg | ORAL_TABLET | Freq: Every day | ORAL | 1 refills | Status: DC

## 2023-09-10 NOTE — Progress Notes (Signed)
 Subjective:    Patient ID: Shelby Spencer, female    DOB: [birth year], 78 y.o.   MRN: [id number]  Patient here for  Chief Complaint  Patient presents with   Medical Management of Chronic Issues    HPI Here for follow up regarding hypercholesterolemia, hypothyroidism and hypertension. Evaluated by Dr Mason Sole 11/01/22 - MCI. Low B12 level - instructed to take oral B12. MRI - No acute intracranial process. Cerebral volume is within normal limits for age. No disproportionate lobar atrophy. Had f/u with neurology 07/07/23 - no changes made. Overall she feels she is doing relatively well. Staying active and involved. Increased social interaction. No chest pain or sob reported. No cough or congestion. No abdominal pain or bowel change reported.    Past Medical History:  Diagnosis Date   Cataract    CKD (chronic kidney disease), stage III (HCC)    History of colon polyps    History of prolapse of bladder    History of pyloric stenosis    Hypercholesterolemia    Hypertension    Thyroid disease    Past Surgical History:  Procedure Laterality Date   CATARACT EXTRACTION, BILATERAL     TONSILLECTOMY     TUBAL LIGATION     Family History  Problem Relation Age of Onset   Hypertension Mother    Heart disease Father    Heart attack Maternal Grandfather    Pancreatic cancer Neg Hx    Rectal cancer Neg Hx    Stomach cancer Neg Hx    Social History   Socioeconomic History   Marital status: Married    Spouse name: Not on file   Number of children: Not on file   Years of education: Not on file   Highest education level: Doctorate  Occupational History   Not on file  Tobacco Use   Smoking status: Never   Smokeless tobacco: Never  Vaping Use   Vaping status: Never Used  Substance and Sexual Activity   Alcohol use: Yes    Alcohol/week: 5.0 standard drinks of alcohol    Types: 5 Glasses of wine per week    Comment: per week   Drug use: No   Sexual activity: Not Currently    Partners:  Male    Comment: 1st intercourse-21, partners- 2  Other Topics Concern   Not on file  Social History Narrative   Not on file   Social Drivers of Health   Financial Resource Strain: Low Risk  (09/07/2023)   Overall Financial Resource Strain (CARDIA)    Difficulty of Paying Living Expenses: Not hard at all  Food Insecurity: No Food Insecurity (09/07/2023)   Hunger Vital Sign    Worried About Running Out of Food in the Last Year: Never true    Ran Out of Food in the Last Year: Never true  Transportation Needs: No Transportation Needs (09/07/2023)   PRAPARE - Administrator, Civil Service (Medical): No    Lack of Transportation (Non-Medical): No  Physical Activity: Insufficiently Active (09/07/2023)   Exercise Vital Sign    Days of Exercise per Week: 1 day    Minutes of Exercise per Session: 20 min  Stress: No Stress Concern Present (09/07/2023)   [employer] of Occupational Health - Occupational Stress Questionnaire    Feeling of Stress : Not at all  Social Connections: Socially Integrated (09/07/2023)   Social Connection and Isolation Panel [NHANES]    Frequency of Communication with Friends and Family: More than  three times a week    Frequency of Social Gatherings with Friends and Family: Twice a week    Attends Religious Services: More than 4 times per year    Active Member of [employer] or Organizations: Yes    Attends Engineer, structural: More than 4 times per year    Marital Status: Married     Review of Systems  Constitutional:  Negative for appetite change and unexpected weight change.  HENT:  Negative for congestion and sinus pressure.   Respiratory:  Negative for cough, chest tightness and shortness of breath.   Cardiovascular:  Negative for chest pain, palpitations and leg swelling.  Gastrointestinal:  Negative for abdominal pain, diarrhea, nausea and vomiting.  Genitourinary:  Negative for difficulty urinating and dysuria.  Musculoskeletal:  Negative  for joint swelling and myalgias.  Skin:  Negative for color change and rash.  Neurological:  Negative for dizziness and headaches.  Psychiatric/Behavioral:  Negative for agitation and dysphoric mood.        Objective:     BP 118/70   Pulse 79   Temp 98 F (36.7 C)   Resp 16   Ht 5\' 8"  (1.727 m)   Wt 176 lb 12.8 oz (80.2 kg)   SpO2 99%   BMI 26.88 kg/m  Wt Readings from Last 3 Encounters:  09/10/23 176 lb 12.8 oz (80.2 kg)  05/08/23 177 lb 6.4 oz (80.5 kg)  12/30/22 173 lb 9.6 oz (78.7 kg)    Physical Exam Vitals reviewed.  Constitutional:      General: She is not in acute distress.    Appearance: Normal appearance.  HENT:     Head: Normocephalic and atraumatic.     Right Ear: External ear normal.     Left Ear: External ear normal.     Mouth/Throat:     Pharynx: No oropharyngeal exudate or posterior oropharyngeal erythema.  Eyes:     General: No scleral icterus.       Right eye: No discharge.        Left eye: No discharge.     Conjunctiva/sclera: Conjunctivae normal.  Neck:     Thyroid: No thyromegaly.  Cardiovascular:     Rate and Rhythm: Normal rate and regular rhythm.  Pulmonary:     Effort: No respiratory distress.     Breath sounds: Normal breath sounds. No wheezing.  Abdominal:     General: Bowel sounds are normal.     Palpations: Abdomen is soft.     Tenderness: There is no abdominal tenderness.  Musculoskeletal:        General: No swelling or tenderness.     Cervical back: Neck supple. No tenderness.  Lymphadenopathy:     Cervical: No cervical adenopathy.  Skin:    Findings: No erythema or rash.  Neurological:     Mental Status: She is alert.  Psychiatric:        Mood and Affect: Mood normal.        Behavior: Behavior normal.         Outpatient Encounter Medications as of 09/10/2023  Medication Sig   amLODipine (NORVASC) 2.5 MG tablet Take 1 tablet (2.5 mg total) by mouth 2 (two) times daily.   atorvastatin (LIPITOR) 10 MG tablet TAKE 1  TABLET BY MOUTH 3 DAYS A WEEK   Calcium Carbonate-Vitamin D (CALCIUM + D PO) Take by mouth daily at 8 pm.   doxycycline (PERIOSTAT) 20 MG tablet Take 20 mg by mouth 2 (two) times daily.  ezetimibe (ZETIA) 10 MG tablet Take 1 tablet (10 mg total) by mouth daily.   levothyroxine (SYNTHROID) 50 MCG tablet TAKE 1 TABLET BY MOUTH ONCE DAILY BEFOREBREAKFAST   losartan (COZAAR) 100 MG tablet Take 1 tablet (100 mg total) by mouth daily.   [DISCONTINUED] amLODipine (NORVASC) 2.5 MG tablet Take 1 tablet (2.5 mg total) by mouth 2 (two) times daily.   [DISCONTINUED] ezetimibe (ZETIA) 10 MG tablet Take 1 tablet (10 mg total) by mouth daily.   [DISCONTINUED] levothyroxine (SYNTHROID) 50 MCG tablet TAKE 1 TABLET BY MOUTH ONCE DAILY BEFOREBREAKFAST   [DISCONTINUED] losartan (COZAAR) 100 MG tablet Take 1 tablet (100 mg total) by mouth daily.   No facility-administered encounter medications on file as of 09/10/2023.     Lab Results  Component Value Date   WBC 5.3 12/25/2022   HGB 13.5 12/25/2022   HCT 41.5 12/25/2022   PLT 346.0 12/25/2022   GLUCOSE 111 (H) 09/08/2023   CHOL 180 09/09/2023   TRIG 97.0 09/09/2023   HDL 64.50 09/09/2023   LDLCALC 96 09/09/2023   ALT 27 09/09/2023   AST 19 09/09/2023   NA 140 09/08/2023   K 4.1 09/08/2023   CL 104 09/08/2023   CREATININE 1.11 09/08/2023   BUN 27 (H) 09/08/2023   CO2 26 09/08/2023   TSH 4.11 12/25/2022   HGBA1C 5.4 09/10/2023    MR BRAIN WO CONTRAST Result Date: 11/26/2022 CLINICAL DATA:  Mild cognitive impairment EXAM: MRI HEAD WITHOUT CONTRAST TECHNIQUE: Multiplanar, multiecho pulse sequences of the brain and surrounding structures were obtained without intravenous contrast. COMPARISON:  None Available. FINDINGS: Brain: No restricted diffusion to suggest acute or subacute infarct. No acute hemorrhage, mass, mass effect, or midline shift. No hydrocephalus or extra-axial collection. Normal pituitary and craniocervical junction. No hemosiderin  deposition to suggest remote hemorrhage or superficial siderosis. Cerebral volume is within normal limits for age. No disproportionate lobar atrophy. No foci of T2 hyperintense signal in the periventricular white matter to suggest chronic small vessel ischemic disease. Vascular: Normal arterial flow voids. Skull and upper cervical spine: Normal marrow signal. Sinuses/Orbits: Clear paranasal sinuses. No acute finding in the orbits. Status post bilateral lens replacements. Other: Small amount of fluid in the right mastoid air cells. IMPRESSION: No acute intracranial process. Cerebral volume is within normal limits for age. No disproportionate lobar atrophy. Electronically Signed   By: Zoila Hines M.D.   On: 11/26/2022 20:36       Assessment & Plan:  History of colonic polyps Assessment & Plan: Colonoscopy 02/2017.  Dr Elvin Hammer.  Overdue f/u colonoscopy. Discussed.  Agreeable for referral.   Orders: -     Ambulatory referral to Gastroenterology  Hypercholesterolemia Assessment & Plan: On zetia and now on lipitor. Tolerating.   Doing well.  Cholesterol improved.  Continue current medication regimen.  Follow lipid panel and liver function tests.  Lab Results  Component Value Date   CHOL 180 09/09/2023   HDL 64.50 09/09/2023   LDLCALC 96 09/09/2023   TRIG 97.0 09/09/2023   CHOLHDL 3 09/09/2023    Orders: -     Lipid panel; Future -     Hepatic function panel; Future  Hyperglycemia Assessment & Plan: Low carb diet and exercise. Follow met b and A1c.   Lab Results  Component Value Date   HGBA1C 5.4 09/10/2023     Orders: -     POCT glycosylated hemoglobin (Hb A1C) -     Hemoglobin A1c; Future  Stage 3a chronic kidney disease (  HCC) Assessment & Plan: GFR 48 - 09/08/23.  Improved. Continue losartan.  Avoid antiinflammatories.  Stay hydrated.  Follow metabolic panel   Orders: -     Basic metabolic panel with GFR; Future  Hypertension, essential Assessment & Plan: On losartan and  amlodipine. Blood pressures as outlined.  Continue current medication regimen.  Follow pressures. Follow metabolic panel.   Orders: -     TSH; Future -     CBC with Differential/Platelet; Future  Hypothyroidism, unspecified type Assessment & Plan: On thyroid replacement.  Follow tsh.    Memory change Assessment & Plan: Evaluated by Dr Mason Sole 11/01/22 - [facility]. Low B12 level - instructed to take oral B12. MRI - No acute intracranial process. Cerebral volume is within normal limits for age. No disproportionate lobar atrophy. Had f/u 03/03/23 and 07/07/23 - no changes made. She reports her memory is stable. No significant change.    Renal artery stenosis [facility]) Assessment & Plan: Evaluated by [facility] (Dr Farrel Hones).   Follow pressure and follow metabolic panel. No change today.    Colon cancer screening -     Ambulatory referral to Gastroenterology  Other orders -     amLODIPine Besylate; Take 1 tablet (2.5 mg total) by mouth 2 (two) times daily.  Dispense: 180 tablet; Refill: 1 -     Ezetimibe; Take 1 tablet (10 mg total) by mouth daily.  Dispense: 90 tablet; Refill: 3 -     Levothyroxine Sodium; TAKE 1 TABLET BY MOUTH ONCE DAILY BEFOREBREAKFAST  Dispense: 90 tablet; Refill: 2 -     Losartan Potassium; Take 1 tablet (100 mg total) by mouth daily.  Dispense: 90 tablet; Refill: 1     Dellar Fenton, MD

## 2023-09-14 ENCOUNTER — Encounter: Payer: Self-pay | Admitting: Internal Medicine

## 2023-09-14 NOTE — Assessment & Plan Note (Signed)
 On thyroid replacement.  Follow tsh.

## 2023-09-14 NOTE — Assessment & Plan Note (Signed)
On losartan and amlodipine. Blood pressures as outlined.  Continue current medication regimen.  Follow pressures.  Follow metabolic panel.  

## 2023-09-14 NOTE — Assessment & Plan Note (Signed)
 Low carb diet and exercise. Follow met b and A1c.   Lab Results  Component Value Date   HGBA1C 5.4 09/10/2023

## 2023-09-14 NOTE — Assessment & Plan Note (Signed)
 Colonoscopy 02/2017.  Dr Elvin Hammer.  Overdue f/u colonoscopy. Discussed.  Agreeable for referral.

## 2023-09-14 NOTE — Assessment & Plan Note (Signed)
 On zetia and now on lipitor. Tolerating.   Doing well.  Cholesterol improved.  Continue current medication regimen.  Follow lipid panel and liver function tests.  Lab Results  Component Value Date   CHOL 180 09/09/2023   HDL 64.50 09/09/2023   LDLCALC 96 09/09/2023   TRIG 97.0 09/09/2023   CHOLHDL 3 09/09/2023

## 2023-09-14 NOTE — Assessment & Plan Note (Signed)
 GFR 48 - 09/08/23.  Improved. Continue losartan.  Avoid antiinflammatories.  Stay hydrated.  Follow metabolic panel

## 2023-09-14 NOTE — Assessment & Plan Note (Signed)
 Evaluated by [facility] (Dr Farrel Hones).   Follow pressure and follow metabolic panel. No change today.

## 2023-09-14 NOTE — Assessment & Plan Note (Signed)
 Evaluated by Dr Mason Sole 11/01/22 - [facility]. Low B12 level - instructed to take oral B12. MRI - No acute intracranial process. Cerebral volume is within normal limits for age. No disproportionate lobar atrophy. Had f/u 03/03/23 and 07/07/23 - no changes made. She reports her memory is stable. No significant change.

## 2023-10-14 ENCOUNTER — Telehealth: Payer: Self-pay | Admitting: Internal Medicine

## 2023-10-14 NOTE — Telephone Encounter (Signed)
 Medical release received by mail on 10/14/2023, to be sent to Dr Jerie Montana, [facility]. Release of info was fax to medical records for release.

## 2023-11-05 DIAGNOSIS — G309 Alzheimer's disease, unspecified: Secondary | ICD-10-CM | POA: Diagnosis not present

## 2023-11-05 DIAGNOSIS — F0280 Dementia in other diseases classified elsewhere without behavioral disturbance: Secondary | ICD-10-CM | POA: Diagnosis not present

## 2023-11-15 DIAGNOSIS — G3189 Other specified degenerative diseases of nervous system: Secondary | ICD-10-CM | POA: Diagnosis not present

## 2023-11-15 DIAGNOSIS — G309 Alzheimer's disease, unspecified: Secondary | ICD-10-CM | POA: Diagnosis not present

## 2023-11-15 DIAGNOSIS — F0280 Dementia in other diseases classified elsewhere without behavioral disturbance: Secondary | ICD-10-CM | POA: Diagnosis not present

## 2023-11-21 ENCOUNTER — Telehealth: Payer: Self-pay

## 2023-11-21 NOTE — Telephone Encounter (Signed)
 I called patient's home number and left a voicemail asking her to please call us  back to reschedule her 01/15/2024 appointment with Dr. Dellar Fenton, as she will be out of the office that day.  I called patient's cell phone - no answer and no voicemail.  I also sent a message to patient via MyChart.  E2C2 - when patient calls back, please assist her with rescheduling her 01/15/2024 appointment with Dr. Dellar Fenton and the associated lab visit on 01/12/2024.

## 2023-12-08 ENCOUNTER — Telehealth: Payer: Self-pay

## 2023-12-08 NOTE — Telephone Encounter (Signed)
 We received a MyChart notification that patient has not read our message.  I left a message with patient's husband asking her to please call us  back to schedule these appointments.

## 2023-12-11 NOTE — Telephone Encounter (Signed)
 Error

## 2023-12-25 DIAGNOSIS — H26493 Other secondary cataract, bilateral: Secondary | ICD-10-CM | POA: Diagnosis not present

## 2024-01-05 DIAGNOSIS — Z006 Encounter for examination for normal comparison and control in clinical research program: Secondary | ICD-10-CM | POA: Diagnosis not present

## 2024-01-05 DIAGNOSIS — G309 Alzheimer's disease, unspecified: Secondary | ICD-10-CM | POA: Diagnosis not present

## 2024-01-05 DIAGNOSIS — E8589 Other amyloidosis: Secondary | ICD-10-CM | POA: Diagnosis not present

## 2024-01-05 DIAGNOSIS — F0280 Dementia in other diseases classified elsewhere without behavioral disturbance: Secondary | ICD-10-CM | POA: Diagnosis not present

## 2024-01-12 ENCOUNTER — Other Ambulatory Visit

## 2024-01-15 ENCOUNTER — Ambulatory Visit: Admitting: Internal Medicine

## 2024-01-16 DIAGNOSIS — G309 Alzheimer's disease, unspecified: Secondary | ICD-10-CM | POA: Diagnosis not present

## 2024-01-16 DIAGNOSIS — G3184 Mild cognitive impairment, so stated: Secondary | ICD-10-CM | POA: Diagnosis not present

## 2024-01-16 DIAGNOSIS — F0280 Dementia in other diseases classified elsewhere without behavioral disturbance: Secondary | ICD-10-CM | POA: Diagnosis not present

## 2024-03-15 ENCOUNTER — Other Ambulatory Visit: Payer: Self-pay | Admitting: Internal Medicine

## 2024-03-23 ENCOUNTER — Ambulatory Visit (INDEPENDENT_AMBULATORY_CARE_PROVIDER_SITE_OTHER): Admitting: Internal Medicine

## 2024-03-23 DIAGNOSIS — R413 Other amnesia: Secondary | ICD-10-CM

## 2024-03-23 NOTE — Progress Notes (Deleted)
 Subjective:    Patient ID: Shelby Spencer, female    DOB: Dec 16, 1945, 78 y.o.   MRN: [id number]  Patient here for No chief complaint on file.   HPI Here for a scheduled follow up - follow up regarding hypercholesterolemia, hypothyroidism and hypertension. Evaluated by Dr Maree 11/01/22 - [facility]. Low B12 level - instructed to take oral B12. MRI - No acute intracranial process. Cerebral volume is within normal limits for age. No disproportionate lobar atrophy. 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 Recommended to start donepezil. Amyloid PET scan ordered. Recommended MRI. 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  Had f/u 01/16/24 - recommended to stop aricept. Started rivastigmine. Planning to start lecanemab infusions.    Past Medical History:  Diagnosis Date   Cataract    CKD (chronic kidney disease), stage III (HCC)    History of colon polyps    History of prolapse of bladder    History of pyloric stenosis    Hypercholesterolemia    Hypertension    Thyroid  disease    Past Surgical History:  Procedure Laterality Date   CATARACT EXTRACTION, BILATERAL     TONSILLECTOMY     TUBAL LIGATION     Family History  Problem Relation Age of Onset   Hypertension Mother    Heart disease Father    Heart attack Maternal Grandfather    Pancreatic cancer Neg Hx    Rectal cancer Neg Hx    Stomach cancer Neg Hx    Social History   Socioeconomic History   Marital status: Married    Spouse name: Not on file   Number of children: Not on file   Years of education: Not on file   Highest education level: Doctorate  Occupational History   Not on file  Tobacco Use   Smoking status: Never   Smokeless tobacco: Never  Vaping Use   Vaping status: Never Used  Substance and Sexual Activity   Alcohol use: Yes    Alcohol/week: 5.0 standard drinks of alcohol    Types: 5 Glasses of wine per week     Comment: per week   Drug use: No   Sexual activity: Not Currently    Partners: Male    Comment: 1st intercourse-21, partners- 2  Other Topics Concern   Not on file  Social History Narrative   Not on file   Social Drivers of Health   Financial Resource Strain: Low Risk  (09/07/2023)   Overall Financial Resource Strain (CARDIA)    Difficulty of Paying Living Expenses: Not hard at all  Food Insecurity: No Food Insecurity (09/07/2023)   Hunger Vital Sign    Worried About Running Out of Food in the Last Year: Never true    Ran Out of Food in the Last Year: Never true  Transportation Needs: No Transportation Needs (09/07/2023)   PRAPARE - Administrator, Civil Service (Medical): No    Lack of Transportation (Non-Medical): No  Physical Activity: Insufficiently Active (09/07/2023)   Exercise Vital Sign    Days of Exercise per Week: 1 day    Minutes of Exercise per Session: 20 min  Stress: No Stress Concern Present (09/07/2023)   [employer] of Occupational Health - Occupational Stress Questionnaire    Feeling of Stress : Not at all  Social Connections: Socially Integrated (09/07/2023)   Social Connection and Isolation Panel    Frequency of  Communication with Friends and Family: More than three times a week    Frequency of Social Gatherings with Friends and Family: Twice a week    Attends Religious Services: More than 4 times per year    Active Member of [employer] or Organizations: Yes    Attends Engineer, structural: More than 4 times per year    Marital Status: Married     Review of Systems     Objective:     There were no vitals taken for this visit. Wt Readings from Last 3 Encounters:  09/10/23 176 lb 12.8 oz (80.2 kg)  05/08/23 177 lb 6.4 oz (80.5 kg)  12/30/22 173 lb 9.6 oz (78.7 kg)    Physical Exam  {Perform Simple Foot Exam  Perform Detailed exam:1} {Insert foot Exam (Optional):30965}   Outpatient Encounter Medications as of 03/23/2024   Medication Sig   amLODipine  (NORVASC ) 2.5 MG tablet TAKE 1 TABLET BY MOUTH TWICE DAILY   atorvastatin  (LIPITOR) 10 MG tablet TAKE 1 TABLET BY MOUTH 3 DAYS A WEEK   Calcium  Carbonate-Vitamin D (CALCIUM  + D PO) Take by mouth daily at 8 pm.   doxycycline (PERIOSTAT) 20 MG tablet Take 20 mg by mouth 2 (two) times daily.    ezetimibe  (ZETIA ) 10 MG tablet Take 1 tablet (10 mg total) by mouth daily.   levothyroxine  (SYNTHROID ) 50 MCG tablet TAKE 1 TABLET BY MOUTH ONCE DAILY ON AN EMPTY STOMACH. WAIT 30 MINUTES BEFORE TAKING OTHER MEDS.   losartan  (COZAAR ) 100 MG tablet Take 1 tablet (100 mg total) by mouth daily.   No facility-administered encounter medications on file as of 03/23/2024.     Lab Results  Component Value Date   WBC 5.3 12/25/2022   HGB 13.5 12/25/2022   HCT 41.5 12/25/2022   PLT 346.0 12/25/2022   GLUCOSE 111 (H) 09/08/2023   CHOL 180 09/09/2023   TRIG 97.0 09/09/2023   HDL 64.50 09/09/2023   LDLCALC 96 09/09/2023   ALT 27 09/09/2023   AST 19 09/09/2023   NA 140 09/08/2023   K 4.1 09/08/2023   CL 104 09/08/2023   CREATININE 1.11 09/08/2023   BUN 27 (H) 09/08/2023   CO2 26 09/08/2023   TSH 4.11 12/25/2022   HGBA1C 5.4 09/10/2023    MR BRAIN WO CONTRAST Result Date: 11/26/2022 CLINICAL DATA:  Mild cognitive impairment EXAM: MRI HEAD WITHOUT CONTRAST TECHNIQUE: Multiplanar, multiecho pulse sequences of the brain and surrounding structures were obtained without intravenous contrast. COMPARISON:  None Available. FINDINGS: Brain: No restricted diffusion to suggest acute or subacute infarct. No acute hemorrhage, mass, mass effect, or midline shift. No hydrocephalus or extra-axial collection. Normal pituitary and craniocervical junction. No hemosiderin deposition to suggest remote hemorrhage or superficial siderosis. Cerebral volume is within normal limits for age. No disproportionate lobar atrophy. No foci of T2 hyperintense signal in the periventricular white matter to suggest  chronic small vessel ischemic disease. Vascular: Normal arterial flow voids. Skull and upper cervical spine: Normal marrow signal. Sinuses/Orbits: Clear paranasal sinuses. No acute finding in the orbits. Status post bilateral lens replacements. Other: Small amount of fluid in the right mastoid air cells. IMPRESSION: No acute intracranial process. Cerebral volume is within normal limits for age. No disproportionate lobar atrophy. Electronically Signed   By: Donald Campion M.D.   On: 11/26/2022 20:36       Assessment & Plan:  There are no diagnoses linked to this encounter.   Allena Hamilton, MD

## 2024-03-28 ENCOUNTER — Encounter: Payer: Self-pay | Admitting: Internal Medicine

## 2024-03-28 NOTE — Progress Notes (Signed)
 Patient ID: Shelby Spencer, female   DOB: 30-Nov-1945, 78 y.o.   MRN: [id number] Did not show for appt.

## 2024-04-26 ENCOUNTER — Ambulatory Visit: Admitting: Internal Medicine

## 2024-04-26 ENCOUNTER — Encounter: Payer: Self-pay | Admitting: Internal Medicine

## 2024-04-26 VITALS — BP 126/60 | HR 66 | Temp 97.5°F | Ht 68.0 in | Wt 176.1 lb

## 2024-04-26 DIAGNOSIS — N1831 Chronic kidney disease, stage 3a: Secondary | ICD-10-CM

## 2024-04-26 DIAGNOSIS — Z1231 Encounter for screening mammogram for malignant neoplasm of breast: Secondary | ICD-10-CM | POA: Diagnosis not present

## 2024-04-26 DIAGNOSIS — I10 Essential (primary) hypertension: Secondary | ICD-10-CM | POA: Diagnosis not present

## 2024-04-26 DIAGNOSIS — Z1211 Encounter for screening for malignant neoplasm of colon: Secondary | ICD-10-CM

## 2024-04-26 DIAGNOSIS — E039 Hypothyroidism, unspecified: Secondary | ICD-10-CM | POA: Diagnosis not present

## 2024-04-26 DIAGNOSIS — R413 Other amnesia: Secondary | ICD-10-CM

## 2024-04-26 DIAGNOSIS — E7800 Pure hypercholesterolemia, unspecified: Secondary | ICD-10-CM | POA: Diagnosis not present

## 2024-04-26 DIAGNOSIS — R739 Hyperglycemia, unspecified: Secondary | ICD-10-CM | POA: Diagnosis not present

## 2024-04-26 LAB — BASIC METABOLIC PANEL WITH GFR
BUN: 22 mg/dL (ref 6–23)
CO2: 26 meq/L (ref 19–32)
Calcium: 9.5 mg/dL (ref 8.4–10.5)
Chloride: 105 meq/L (ref 96–112)
Creatinine, Ser: 1.05 mg/dL (ref 0.40–1.20)
GFR: 51.3 mL/min — ABNORMAL LOW (ref >60.00)
Glucose, Bld: 108 mg/dL — ABNORMAL HIGH (ref 70–99)
Potassium: 4.2 meq/L (ref 3.5–5.1)
Sodium: 140 meq/L (ref 135–145)

## 2024-04-26 LAB — LIPID PANEL
Cholesterol: 201 mg/dL — ABNORMAL HIGH (ref 0–200)
HDL: 72 mg/dL (ref >39.00)
LDL Cholesterol: 105 mg/dL — ABNORMAL HIGH (ref 0–99)
NonHDL: 128.53
Total CHOL/HDL Ratio: 3
Triglycerides: 120 mg/dL (ref 0.0–149.0)
VLDL: 24 mg/dL (ref 0.0–40.0)

## 2024-04-26 LAB — CBC WITH DIFFERENTIAL/PLATELET
Basophils Absolute: 0 K/uL (ref 0.0–0.1)
Basophils Relative: 0.5 % (ref 0.0–3.0)
Eosinophils Absolute: 0.1 K/uL (ref 0.0–0.7)
Eosinophils Relative: 1.6 % (ref 0.0–5.0)
HCT: 40 % (ref 36.0–46.0)
Hemoglobin: 13.3 g/dL (ref 12.0–15.0)
Lymphocytes Relative: 29 % (ref 12.0–46.0)
Lymphs Abs: 1.6 K/uL (ref 0.7–4.0)
MCHC: 33.3 g/dL (ref 30.0–36.0)
MCV: 92.4 fl (ref 78.0–100.0)
Monocytes Absolute: 0.5 K/uL (ref 0.1–1.0)
Monocytes Relative: 9.9 % (ref 3.0–12.0)
Neutro Abs: 3.2 K/uL (ref 1.4–7.7)
Neutrophils Relative %: 59 % (ref 43.0–77.0)
Platelets: 320 K/uL (ref 150.0–400.0)
RBC: 4.33 Mil/uL (ref 3.87–5.11)
RDW: 14.9 % (ref 11.5–15.5)
WBC: 5.5 K/uL (ref 4.0–10.5)

## 2024-04-26 LAB — HEPATIC FUNCTION PANEL
ALT: 18 U/L (ref 0–35)
AST: 17 U/L (ref 0–37)
Albumin: 4.6 g/dL (ref 3.5–5.2)
Alkaline Phosphatase: 77 U/L (ref 39–117)
Bilirubin, Direct: 0.1 mg/dL (ref 0.0–0.3)
Total Bilirubin: 0.7 mg/dL (ref 0.2–1.2)
Total Protein: 6.9 g/dL (ref 6.0–8.3)

## 2024-04-26 LAB — HEMOGLOBIN A1C: Hgb A1c MFr Bld: 5.4 % (ref 4.6–6.5)

## 2024-04-26 LAB — TSH: TSH: 4.26 u[IU]/mL (ref 0.35–5.50)

## 2024-04-26 MED ORDER — LOSARTAN POTASSIUM 100 MG PO TABS: 100.0000 mg | ORAL_TABLET | Freq: Every day | ORAL | 1 refills | Status: AC

## 2024-04-26 MED ORDER — ATORVASTATIN CALCIUM 10 MG PO TABS: ORAL_TABLET | ORAL | 3 refills | Status: DC

## 2024-04-26 NOTE — Progress Notes (Signed)
 Subjective:    Patient ID: Shelby Spencer, female    DOB: May 26, 1946, 78 y.o.   MRN: [id number]  Patient here for  Chief Complaint  Patient presents with   Medical Management of Chronic Issues    4 mth f/u    HPI Here for a scheduled follow up - follow up regarding hypercholesterolemia, hypothyroidism and hypertension. Evaluated by Dr Maree 11/01/22 - MCI. Low B12 level - instructed to take oral B12. MRI - No acute intracranial process. Cerebral volume is within normal limits for age. No disproportionate lobar atrophy. 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 Recommended to start donepezil. Amyloid PET scan ordered. Recommended MRI. 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  Had f/u 01/16/24 - recommended to stop aricept. Started rivastigmine. Planning to start lecanemab infusions. Trying to stay active. No chest pain or sob reported. No abdominal pain or bowel change reported.    Past Medical History:  Diagnosis Date   Cataract    CKD (chronic kidney disease), stage III (HCC)    History of colon polyps    History of prolapse of bladder    History of pyloric stenosis    Hypercholesterolemia    Hypertension    Thyroid  disease    Past Surgical History:  Procedure Laterality Date   CATARACT EXTRACTION, BILATERAL     TONSILLECTOMY     TUBAL LIGATION     Family History  Problem Relation Age of Onset   Hypertension Mother    Heart disease Father    Heart attack Maternal Grandfather    Pancreatic cancer Neg Hx    Rectal cancer Neg Hx    Stomach cancer Neg Hx    Social History   Socioeconomic History   Marital status: Married    Spouse name: Not on file   Number of children: Not on file   Years of education: Not on file   Highest education level: Doctorate  Occupational History   Not on file  Tobacco Use   Smoking status: Never   Smokeless tobacco: Never  Vaping Use   Vaping  status: Never Used  Substance and Sexual Activity   Alcohol use: Yes    Alcohol/week: 5.0 standard drinks of alcohol    Types: 5 Glasses of wine per week    Comment: per week   Drug use: No   Sexual activity: Not Currently    Partners: Male    Comment: 1st intercourse-21, partners- 2  Other Topics Concern   Not on file  Social History Narrative   Not on file   Social Drivers of Health   Financial Resource Strain: Low Risk  (09/07/2023)   Overall Financial Resource Strain (CARDIA)    Difficulty of Paying Living Expenses: Not hard at all  Food Insecurity: No Food Insecurity (09/07/2023)   Hunger Vital Sign    Worried About Running Out of Food in the Last Year: Never true    Ran Out of Food in the Last Year: Never true  Transportation Needs: No Transportation Needs (09/07/2023)   PRAPARE - Administrator, Civil Service (Medical): No    Lack of Transportation (Non-Medical): No  Physical Activity: Insufficiently Active (09/07/2023)   Exercise Vital Sign    Days of Exercise per Week: 1 day    Minutes of Exercise per Session: 20 min  Stress: No Stress Concern Present (09/07/2023)   [employer] of Occupational  Health - Occupational Stress Questionnaire    Feeling of Stress : Not at all  Social Connections: Socially Integrated (09/07/2023)   Social Connection and Isolation Panel    Frequency of Communication with Friends and Family: More than three times a week    Frequency of Social Gatherings with Friends and Family: Twice a week    Attends Religious Services: More than 4 times per year    Active Member of [employer] or Organizations: Yes    Attends Engineer, Structural: More than 4 times per year    Marital Status: Married     Review of Systems  Constitutional:  Negative for appetite change and unexpected weight change.  HENT:  Negative for congestion and sinus pressure.   Respiratory:  Negative for cough, chest tightness and shortness of breath.   Cardiovascular:   Negative for chest pain, palpitations and leg swelling.  Gastrointestinal:  Negative for abdominal pain, diarrhea, nausea and vomiting.  Genitourinary:  Negative for difficulty urinating and dysuria.  Musculoskeletal:  Negative for joint swelling and myalgias.  Skin:  Negative for color change and rash.  Neurological:  Negative for dizziness and headaches.  Psychiatric/Behavioral:  Negative for agitation and dysphoric mood.        Objective:     BP 126/60   Pulse 66   Temp (!) 97.5 F (36.4 C) (Oral)   Ht 5' 8 (1.727 m)   Wt 176 lb 2 oz (79.9 kg)   SpO2 98%   BMI 26.78 kg/m  Wt Readings from Last 3 Encounters:  04/26/24 176 lb 2 oz (79.9 kg)  09/10/23 176 lb 12.8 oz (80.2 kg)  05/08/23 177 lb 6.4 oz (80.5 kg)    Physical Exam Vitals reviewed.  Constitutional:      General: She is not in acute distress.    Appearance: Normal appearance.  HENT:     Head: Normocephalic and atraumatic.     Right Ear: External ear normal.     Left Ear: External ear normal.     Mouth/Throat:     Pharynx: No oropharyngeal exudate or posterior oropharyngeal erythema.  Eyes:     General: No scleral icterus.       Right eye: No discharge.        Left eye: No discharge.     Conjunctiva/sclera: Conjunctivae normal.  Neck:     Thyroid : No thyromegaly.  Cardiovascular:     Rate and Rhythm: Normal rate and regular rhythm.  Pulmonary:     Effort: No respiratory distress.     Breath sounds: Normal breath sounds. No wheezing.  Abdominal:     General: Bowel sounds are normal.     Palpations: Abdomen is soft.     Tenderness: There is no abdominal tenderness.  Musculoskeletal:        General: No swelling or tenderness.     Cervical back: Neck supple. No tenderness.  Lymphadenopathy:     Cervical: No cervical adenopathy.  Skin:    Findings: No erythema or rash.  Neurological:     Mental Status: She is alert.  Psychiatric:        Mood and Affect: Mood normal.        Behavior: Behavior  normal.         Outpatient Encounter Medications as of 04/26/2024  Medication Sig   amLODipine  (NORVASC ) 2.5 MG tablet TAKE 1 TABLET BY MOUTH TWICE DAILY   Calcium  Carbonate-Vitamin D (CALCIUM  + D PO) Take by mouth daily at 8  pm.   doxycycline (PERIOSTAT) 20 MG tablet Take 20 mg by mouth 2 (two) times daily.    ezetimibe  (ZETIA ) 10 MG tablet Take 1 tablet (10 mg total) by mouth daily.   levothyroxine  (SYNTHROID ) 50 MCG tablet TAKE 1 TABLET BY MOUTH ONCE DAILY ON AN EMPTY STOMACH. WAIT 30 MINUTES BEFORE TAKING OTHER MEDS.   atorvastatin  (LIPITOR) 10 MG tablet TAKE 1 TABLET BY MOUTH 3 DAYS A WEEK   losartan  (COZAAR ) 100 MG tablet Take 1 tablet (100 mg total) by mouth daily.   [DISCONTINUED] atorvastatin  (LIPITOR) 10 MG tablet TAKE 1 TABLET BY MOUTH 3 DAYS A WEEK   [DISCONTINUED] losartan  (COZAAR ) 100 MG tablet Take 1 tablet (100 mg total) by mouth daily.   No facility-administered encounter medications on file as of 04/26/2024.     Lab Results  Component Value Date   WBC 5.5 04/26/2024   HGB 13.3 04/26/2024   HCT 40.0 04/26/2024   PLT 320.0 04/26/2024   GLUCOSE 108 (H) 04/26/2024   CHOL 201 (H) 04/26/2024   TRIG 120.0 04/26/2024   HDL 72.00 04/26/2024   LDLCALC 105 (H) 04/26/2024   ALT 18 04/26/2024   AST 17 04/26/2024   NA 140 04/26/2024   K 4.2 04/26/2024   CL 105 04/26/2024   CREATININE 1.05 04/26/2024   BUN 22 04/26/2024   CO2 26 04/26/2024   TSH 4.26 04/26/2024   HGBA1C 5.4 04/26/2024    MR BRAIN WO CONTRAST Result Date: 11/26/2022 CLINICAL DATA:  Mild cognitive impairment EXAM: MRI HEAD WITHOUT CONTRAST TECHNIQUE: Multiplanar, multiecho pulse sequences of the brain and surrounding structures were obtained without intravenous contrast. COMPARISON:  None Available. FINDINGS: Brain: No restricted diffusion to suggest acute or subacute infarct. No acute hemorrhage, mass, mass effect, or midline shift. No hydrocephalus or extra-axial collection. Normal pituitary and  craniocervical junction. No hemosiderin deposition to suggest remote hemorrhage or superficial siderosis. Cerebral volume is within normal limits for age. No disproportionate lobar atrophy. No foci of T2 hyperintense signal in the periventricular white matter to suggest chronic small vessel ischemic disease. Vascular: Normal arterial flow voids. Skull and upper cervical spine: Normal marrow signal. Sinuses/Orbits: Clear paranasal sinuses. No acute finding in the orbits. Status post bilateral lens replacements. Other: Small amount of fluid in the right mastoid air cells. IMPRESSION: No acute intracranial process. Cerebral volume is within normal limits for age. No disproportionate lobar atrophy. Electronically Signed   By: Donald Campion M.D.   On: 11/26/2022 20:36       Assessment & Plan:  Encounter for screening mammogram for malignant neoplasm of breast  Screening for colon cancer  Memory change Assessment & Plan: Evaluated by Dr Maree 11/01/22 - MCI. Low B12 level - instructed to take oral B12. MRI - No acute intracranial process. Cerebral volume is within normal limits for age. No disproportionate lobar atrophy. 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 Recommended to start donepezil. Amyloid PET scan ordered. Recommended MRI. 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  Had f/u 01/16/24 - recommended to stop aricept. Started rivastigmine. Planning to start lecanemab infusions.    Hypertension, essential Assessment & Plan: On losartan  and amlodipine . Blood pressures as outlined.  Continue current medication regimen.  Follow pressures. Follow metabolic panel.   Orders: -     CBC with Differential/Platelet -     TSH  Stage 3a chronic kidney disease (HCC) Assessment & Plan: Continue losartan .  Avoid antiinflammatory medication. Stay hydrated. Follow metabolic panel.   Orders: -     Basic metabolic  panel with GFR  Hyperglycemia Assessment & Plan: Low carb diet and exercise. Follow met b and A1c.   Lab Results  Component Value Date   HGBA1C 5.4 04/26/2024     Orders: -     Hemoglobin A1c  Hypercholesterolemia Assessment & Plan: On zetia  and lipitor. Tolerating.   Follow lipid panel and liver function tests. Continue diet and exercise.  Lab Results  Component Value Date   CHOL 201 (H) 04/26/2024   HDL 72.00 04/26/2024   LDLCALC 105 (H) 04/26/2024   TRIG 120.0 04/26/2024   CHOLHDL 3 04/26/2024    Orders: -     Hepatic function panel -     Lipid panel  Hypothyroidism, unspecified type Assessment & Plan: On thyroid  replacement. Follow tsh.    Other orders -     Atorvastatin  Calcium ; TAKE 1 TABLET BY MOUTH 3 DAYS A WEEK  Dispense: 39 tablet; Refill: 3 -     Losartan  Potassium; Take 1 tablet (100 mg total) by mouth daily.  Dispense: 90 tablet; Refill: 1     Allena Hamilton, MD

## 2024-04-27 ENCOUNTER — Ambulatory Visit: Payer: Self-pay | Admitting: Internal Medicine

## 2024-05-02 ENCOUNTER — Encounter: Payer: Self-pay | Admitting: Internal Medicine

## 2024-05-02 NOTE — Assessment & Plan Note (Signed)
 On zetia  and lipitor. Tolerating.   Follow lipid panel and liver function tests. Continue diet and exercise.  Lab Results  Component Value Date   CHOL 201 (H) 04/26/2024   HDL 72.00 04/26/2024   LDLCALC 105 (H) 04/26/2024   TRIG 120.0 04/26/2024   CHOLHDL 3 04/26/2024

## 2024-05-02 NOTE — Assessment & Plan Note (Signed)
 Evaluated by Dr Maree 11/01/22 - MCI. Low B12 level - instructed to take oral B12. MRI - No acute intracranial process. Cerebral volume is within normal limits for age. No disproportionate lobar atrophy. 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 Recommended to start donepezil. Amyloid PET scan ordered. Recommended MRI. 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  Had f/u 01/16/24 - recommended to stop aricept. Started rivastigmine. Planning to start lecanemab infusions.

## 2024-05-02 NOTE — Assessment & Plan Note (Signed)
 Low carb diet and exercise. Follow met b and A1c.   Lab Results  Component Value Date   HGBA1C 5.4 04/26/2024

## 2024-05-02 NOTE — Assessment & Plan Note (Signed)
 On thyroid replacement.  Follow tsh.

## 2024-05-02 NOTE — Assessment & Plan Note (Signed)
On losartan and amlodipine. Blood pressures as outlined.  Continue current medication regimen.  Follow pressures.  Follow metabolic panel.  

## 2024-05-02 NOTE — Assessment & Plan Note (Signed)
 Continue losartan . Avoid antiinflammatory medication. Stay hydrated. Follow metabolic panel.

## 2024-05-04 DIAGNOSIS — Z006 Encounter for examination for normal comparison and control in clinical research program: Secondary | ICD-10-CM | POA: Diagnosis not present

## 2024-05-04 DIAGNOSIS — F0280 Dementia in other diseases classified elsewhere without behavioral disturbance: Secondary | ICD-10-CM | POA: Diagnosis not present

## 2024-05-04 DIAGNOSIS — G309 Alzheimer's disease, unspecified: Secondary | ICD-10-CM | POA: Diagnosis not present

## 2024-05-11 MED ORDER — ATORVASTATIN CALCIUM 10 MG PO TABS: 10.0000 mg | ORAL_TABLET | Freq: Every day | ORAL | 1 refills | Status: AC

## 2024-05-11 NOTE — Telephone Encounter (Signed)
 FYI. You can see me about this one. Changed lipior directions to daily. Went ahead and sent in new prescription.

## 2024-08-24 ENCOUNTER — Ambulatory Visit: Admitting: Internal Medicine
